# Patient Record
Sex: Female | Born: 2000
Health system: Southern US, Community
[De-identification: ages and names within clinical notes are randomized; demographics above are authoritative.]

## PROBLEM LIST (undated history)

## (undated) DIAGNOSIS — K219 Gastro-esophageal reflux disease without esophagitis: Secondary | ICD-10-CM

## (undated) DIAGNOSIS — IMO0001 Reserved for inherently not codable concepts without codable children: Secondary | ICD-10-CM

## (undated) DIAGNOSIS — K259 Gastric ulcer, unspecified as acute or chronic, without hemorrhage or perforation: Secondary | ICD-10-CM

## (undated) DIAGNOSIS — J302 Other seasonal allergic rhinitis: Secondary | ICD-10-CM

## (undated) DIAGNOSIS — Z464 Encounter for fitting and adjustment of orthodontic device: Secondary | ICD-10-CM

## (undated) DIAGNOSIS — A749 Chlamydial infection, unspecified: Secondary | ICD-10-CM

## (undated) DIAGNOSIS — F419 Anxiety disorder, unspecified: Secondary | ICD-10-CM

## (undated) DIAGNOSIS — B974 Respiratory syncytial virus as the cause of diseases classified elsewhere: Secondary | ICD-10-CM

## (undated) DIAGNOSIS — F32A Depression, unspecified: Secondary | ICD-10-CM

## (undated) DIAGNOSIS — B338 Other specified viral diseases: Secondary | ICD-10-CM

## (undated) HISTORY — DX: Gastro-esophageal reflux disease without esophagitis: K21.9

## (undated) HISTORY — DX: Gastric ulcer, unspecified as acute or chronic, without hemorrhage or perforation: K25.9

## (undated) HISTORY — DX: Depression, unspecified: F32.A

## (undated) HISTORY — DX: Anxiety disorder, unspecified: F41.9

## (undated) HISTORY — PX: DENTAL REHABILITATION: SHX1449

---
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
## 2019-01-16 ENCOUNTER — Ambulatory Visit: Payer: BLUE CROSS/BLUE SHIELD | Admitting: Obstetrics and Gynecology

## 2019-01-17 ENCOUNTER — Encounter: Payer: Self-pay | Admitting: Obstetrics and Gynecology

## 2019-01-17 ENCOUNTER — Ambulatory Visit (INDEPENDENT_AMBULATORY_CARE_PROVIDER_SITE_OTHER): Payer: BC Managed Care – PPO | Admitting: Obstetrics and Gynecology

## 2019-01-17 ENCOUNTER — Other Ambulatory Visit: Payer: Self-pay

## 2019-01-17 VITALS — BP 102/70 | Ht 65.0 in | Wt 233.0 lb

## 2019-01-17 DIAGNOSIS — F419 Anxiety disorder, unspecified: Secondary | ICD-10-CM | POA: Diagnosis not present

## 2019-01-17 DIAGNOSIS — F329 Major depressive disorder, single episode, unspecified: Secondary | ICD-10-CM

## 2019-01-17 DIAGNOSIS — F32A Depression, unspecified: Secondary | ICD-10-CM

## 2019-01-17 MED ORDER — CITALOPRAM HYDROBROMIDE 20 MG PO TABS: ORAL_TABLET | ORAL | 0 refills | Status: DC

## 2019-01-17 NOTE — Patient Instructions (Signed)
I value your feedback and entrusting us with your care. If you get a [facility] patient survey, I would appreciate you taking the time to let us know about your experience today. Thank you! 

## 2019-01-17 NOTE — Progress Notes (Signed)
Ronnell Freshwater, NP   Chief Complaint  Patient presents with  . Follow-up    anxiety is terrible, gain weight    HPI:      [name] is a 18 y.o. G0P0000 who LMP was Patient's last menstrual period was 12/17/2018 (exact date)., presents today for anxiety/depression f/u. She has a long hx of anxiety without tx, but started on zoloft 25 mg about a yr ago. Sx improved at 2/20 appt but have since worsened and pt has had significant wt gain since 2/20 appt (33# due to increased appetite). Pt with daytime worry, but able to sleep at night. No SI. Pt would like to try celexa since her mom is on it with sx control. Pt has been trying to lose wt with exercise/MyFitness Pal app without success. Wanted to try phentermine.  She was also changed from [place] to yaz at 2/20 appt due to anxiety/emotional lability. Pt doesn't think yaz is cause of her increased sx and wants to continue this pill. Menses monthly, lasting 6 days, no BTB, mild dysmen occas.  Due for annual.    Past Medical History:  Diagnosis Date  . Anxiety   . GERD (gastroesophageal reflux disease)   . Orthodontics    Invisalign  . RSV (respiratory syncytial virus infection)    as infant  . Stomach ulcer     Past Surgical History:  Procedure Laterality Date  . DENTAL REHABILITATION     age 62  . TONSILLECTOMY AND ADENOIDECTOMY Bilateral 02/07/2017   Procedure: TONSILLECTOMY;  Surgeon: Clyde Canterbury, MD;  Location: [place];  Service: ENT;  Laterality: Bilateral;    Family History  Problem Relation Age of Onset  . Kidney disease Mother   . Breast cancer Neg Hx   . Ovarian cancer Neg Hx     Social History   Socioeconomic History  . Marital status: Single    Spouse name: Not on file  . Number of children: Not on file  . Years of education: Not on file  . Highest education level: Not on file  Occupational History  . Not on file  Social Needs  . Financial resource strain: Not on file   . Food insecurity    Worry: Not on file    Inability: Not on file  . Transportation needs    Medical: Not on file    Non-medical: Not on file  Tobacco Use  . Smoking status: Never Smoker  . Smokeless tobacco: Never Used  Substance and Sexual Activity  . Alcohol use: Never    Frequency: Never  . Drug use: Never  . Sexual activity: Yes    Birth control/protection: Pill  Lifestyle  . Physical activity    Days per week: Not on file    Minutes per session: Not on file  . Stress: Not on file  Relationships  . Social Herbalist on phone: Not on file    Gets together: Not on file    Attends religious service: Not on file    Active member of club or organization: Not on file    Attends meetings of clubs or organizations: Not on file    Relationship status: Not on file  . Intimate partner violence    Fear of current or ex partner: Not on file    Emotionally abused: Not on file    Physically abused: Not on file    Forced sexual activity: Not on file  Other Topics Concern  . Not on file  Social History Narrative  . Not on file    Outpatient Medications Prior to Visit  Medication Sig Dispense Refill  . drospirenone-ethinyl estradiol (YAZ) 3-0.02 MG tablet Take 1 tablet by mouth daily. 3 Package 1  . sertraline (ZOLOFT) 25 MG tablet Take 1 tablet (25 mg total) by mouth daily. 90 tablet 1   No facility-administered medications prior to visit.       ROS:  Review of Systems  Constitutional: Negative for fatigue, fever and unexpected weight change.  Respiratory: Negative for cough, shortness of breath and wheezing.   Cardiovascular: Negative for chest pain, palpitations and leg swelling.  Gastrointestinal: Negative for blood in stool, constipation, diarrhea, nausea and vomiting.  Endocrine: Negative for cold intolerance, heat intolerance and polyuria.  Genitourinary: Negative for dyspareunia, dysuria, flank pain, frequency, genital sores, hematuria, menstrual  problem, pelvic pain, urgency, vaginal bleeding, vaginal discharge and vaginal pain.  Musculoskeletal: Negative for back pain, joint swelling and myalgias.  Skin: Negative for rash.  Neurological: Negative for dizziness, syncope, light-headedness, numbness and headaches.  Hematological: Negative for adenopathy.  Psychiatric/Behavioral: Positive for agitation. Negative for confusion, sleep disturbance and suicidal ideas. The patient is not nervous/anxious.     OBJECTIVE:   Vitals:  BP 102/70   Ht 5\' 5"  (1.651 m)   Wt 233 lb (105.7 kg)   LMP 12/17/2018 (Exact Date)   BMI 38.77 kg/m   Physical Exam Vitals signs reviewed.  Constitutional:      Appearance: She is well-developed.  Neck:     Musculoskeletal: Normal range of motion.  Pulmonary:     Effort: Pulmonary effort is normal.  Musculoskeletal: Normal range of motion.  Skin:    General: Skin is warm and dry.  Neurological:     General: No focal deficit present.     Mental Status: She is alert and oriented to person, place, and time.     Cranial Nerves: No cranial nerve deficit.  Psychiatric:        Mood and Affect: Mood normal.        Behavior: Behavior normal.        Thought Content: Thought content normal.        Judgment: Judgment normal.     Results:  GAD 7 : Generalized Anxiety Score 01/17/2019  Nervous, Anxious, on Edge 3  Control/stop worrying 3  Worry too much - different things 3  Trouble relaxing 3  Restless 1  Easily annoyed or irritable 2  Afraid - awful might happen 3  Total GAD 7 Score 18  Anxiety Difficulty Very difficult     Depression screen PHQ 2/9 01/17/2019  Decreased Interest 2  Down, Depressed, Hopeless 3  PHQ - 2 Score 5  Altered sleeping 2  Tired, decreased energy 3  Change in appetite 3  Feeling bad or failure about yourself  3  Trouble concentrating 1  Moving slowly or fidgety/restless 1  Suicidal thoughts 0  PHQ-9 Score 18  Difficult doing work/chores Very difficult     Assessment/Plan: Anxiety and depression - Plan: citalopram (CELEXA) 20 MG tablet Change from zoloft to celexa. Can stop zoloft since only on 25 mg dose, start celexa today. RTO in 4 wks for sx f/u at annual.   Weight gain--pt interested in phentermine. Will eval appetite/anxiety sx at 4 wk f/u first and then discuss Rx meds prn.     Meds ordered this encounter  Medications  . citalopram (CELEXA) 20 MG tablet  Sig: Take 1/2 tab daily for 6 days, then 1 tab daily    Dispense:  30 tablet    Refill:  0    Order Specific Question:   Supervising Provider    Answer:   HARRIS, Nadara MustardROBERT P [[id number]]      Return in about 4 weeks (around 02/14/2019) for annual with anxiety/depression f/u.  Kenyona Rena B. Drina Jobst, PA-C 01/17/2019 11:48 AM

## 2019-01-21 ENCOUNTER — Other Ambulatory Visit: Payer: Self-pay | Admitting: Obstetrics and Gynecology

## 2019-01-21 DIAGNOSIS — F419 Anxiety disorder, unspecified: Secondary | ICD-10-CM

## 2019-02-17 NOTE — Progress Notes (Signed)
Ronnell Freshwater, NP   Chief Complaint  Patient presents with  . Gynecologic Exam    HPI:      [name] is a 18 y.o. G0P0000 who LMP was Patient's last menstrual period was 02/17/2019 (exact date)., presents today for anxiety/depression f/u. Long hx of anxiety, started on sertraline 2019. Worked initially but then anxiety sx worsened. Changed to citalopram 8/20 since that is what her mom is on. Pt's sx much improved, no side effects. Wants to continue meds. Has noted wt gain since then, however. Is trying to count calories with MyFitness Pal app (getting 1300 cal daily) and exercising more but has gained 6# by our scales in past month. Pt concerned that happening so fast.   Was supposed to do annual today but wants to reschedule that due to heavier day of menses. Needs OCP RF till appt. Doing well with yaz.    Past Medical History:  Diagnosis Date  . Anxiety   . GERD (gastroesophageal reflux disease)   . Orthodontics    Invisalign  . RSV (respiratory syncytial virus infection)    as infant  . Stomach ulcer     Past Surgical History:  Procedure Laterality Date  . DENTAL REHABILITATION     age 60  . TONSILLECTOMY AND ADENOIDECTOMY Bilateral 02/07/2017   Procedure: TONSILLECTOMY;  Surgeon: Clyde Canterbury, MD;  Location: [place];  Service: ENT;  Laterality: Bilateral;    Family History  Problem Relation Age of Onset  . Kidney disease Mother   . Breast cancer Neg Hx   . Ovarian cancer Neg Hx     Social History   Socioeconomic History  . Marital status: Single    Spouse name: Not on file  . Number of children: Not on file  . Years of education: Not on file  . Highest education level: Not on file  Occupational History  . Not on file  Social Needs  . Financial resource strain: Not on file  . Food insecurity    Worry: Not on file    Inability: Not on file  . Transportation needs    Medical: Not on file    Non-medical: Not on file  Tobacco  Use  . Smoking status: Never Smoker  . Smokeless tobacco: Never Used  Substance and Sexual Activity  . Alcohol use: Never    Frequency: Never  . Drug use: Never  . Sexual activity: Yes    Birth control/protection: Pill  Lifestyle  . Physical activity    Days per week: Not on file    Minutes per session: Not on file  . Stress: Not on file  Relationships  . Social Herbalist on phone: Not on file    Gets together: Not on file    Attends religious service: Not on file    Active member of club or organization: Not on file    Attends meetings of clubs or organizations: Not on file    Relationship status: Not on file  . Intimate partner violence    Fear of current or ex partner: Not on file    Emotionally abused: Not on file    Physically abused: Not on file    Forced sexual activity: Not on file  Other Topics Concern  . Not on file  Social History Narrative  . Not on file    Outpatient Medications Prior to Visit  Medication Sig Dispense Refill  . citalopram (CELEXA) 20 MG  tablet Take 1/2 tab daily for 6 days, then 1 tab daily 30 tablet 0  . drospirenone-ethinyl estradiol (YAZ) 3-0.02 MG tablet Take 1 tablet by mouth daily. 3 Package 1   No facility-administered medications prior to visit.       ROS:  Review of Systems  Constitutional: Positive for unexpected weight change. Negative for fatigue and fever.  Respiratory: Negative for cough, shortness of breath and wheezing.   Cardiovascular: Negative for chest pain, palpitations and leg swelling.  Gastrointestinal: Negative for blood in stool, constipation, diarrhea, nausea and vomiting.  Endocrine: Negative for cold intolerance, heat intolerance and polyuria.  Genitourinary: Negative for dyspareunia, dysuria, flank pain, frequency, genital sores, hematuria, menstrual problem, pelvic pain, urgency, vaginal bleeding, vaginal discharge and vaginal pain.  Musculoskeletal: Negative for back pain, joint swelling and  myalgias.  Skin: Negative for rash.  Neurological: Negative for dizziness, syncope, light-headedness, numbness and headaches.  Hematological: Negative for adenopathy.  Psychiatric/Behavioral: Negative for agitation, confusion, sleep disturbance and suicidal ideas. The patient is not nervous/anxious.    BREAST: No symptoms   OBJECTIVE:   Vitals:  BP 130/90   Ht 5\' 5"  (1.651 m)   Wt 239 lb (108.4 kg)   LMP 02/17/2019 (Exact Date)   BMI 39.77 kg/m   Physical Exam Vitals signs reviewed.  Constitutional:      Appearance: She is well-developed.  Neck:     Musculoskeletal: Normal range of motion.  Pulmonary:     Effort: Pulmonary effort is normal.  Musculoskeletal: Normal range of motion.  Skin:    General: Skin is warm and dry.  Neurological:     General: No focal deficit present.     Mental Status: She is alert and oriented to person, place, and time.     Cranial Nerves: No cranial nerve deficit.  Psychiatric:        Mood and Affect: Mood normal.        Behavior: Behavior normal.        Thought Content: Thought content normal.        Judgment: Judgment normal.     Results:  GAD 7 : Generalized Anxiety Score 02/18/2019 01/17/2019  Nervous, Anxious, on Edge 1 3  Control/stop worrying 1 3  Worry too much - different things 1 3  Trouble relaxing 0 3  Restless 0 1  Easily annoyed or irritable 1 2  Afraid - awful might happen 1 3  Total GAD 7 Score 5 18  Anxiety Difficulty Somewhat difficult Very difficult    Depression screen PHQ 2/9 02/18/2019  Decreased Interest 0  Down, Depressed, Hopeless 1  PHQ - 2 Score 1  Altered sleeping 0  Tired, decreased energy 0  Change in appetite 2  Feeling bad or failure about yourself  0  Trouble concentrating 0  Moving slowly or fidgety/restless 0  Suicidal thoughts 0  PHQ-9 Score 3  Difficult doing work/chores Not difficult at all    Assessment/Plan: Anxiety and depression - Plan: citalopram (CELEXA) 20 MG tablet; Sx much  improved. Cont celexa. RF until can come for annual.  Weight gain--re-eval at annual. Increase calories to 1500 daily (may be getting too few) and get high quality calories in fruit/veggies/lean meats. Cont exercise.   Encounter for surveillance of contraceptive pills--OCP RF. F/u for annual in 1 mo.   Meds ordered this encounter  Medications  . drospirenone-ethinyl estradiol (YAZ) 3-0.02 MG tablet    Sig: Take 1 tablet by mouth daily.    Dispense:  3  Package    Refill:  0    Order Specific Question:   Supervising Provider    Answer:   Nadara Mustard [id number]  . citalopram (CELEXA) 20 MG tablet    Sig: Take 1 tablet (20 mg total) by mouth daily.    Dispense:  30 tablet    Refill:  0    Order Specific Question:   Supervising Provider    Answer:   Nadara Mustard [[id number]]      Return in about 1 week (around 02/25/2019) for annual (needs to reschedule annual appt from today due to bleeding).  Barnabas Henriques B. Jaquane Boughner, PA-C 02/18/2019 4:41 PM

## 2019-02-18 ENCOUNTER — Ambulatory Visit (INDEPENDENT_AMBULATORY_CARE_PROVIDER_SITE_OTHER): Payer: BC Managed Care – PPO | Admitting: Obstetrics and Gynecology

## 2019-02-18 ENCOUNTER — Encounter: Payer: Self-pay | Admitting: Obstetrics and Gynecology

## 2019-02-18 ENCOUNTER — Other Ambulatory Visit: Payer: Self-pay

## 2019-02-18 VITALS — BP 130/90 | Ht 65.0 in | Wt 239.0 lb

## 2019-02-18 DIAGNOSIS — F329 Major depressive disorder, single episode, unspecified: Secondary | ICD-10-CM | POA: Diagnosis not present

## 2019-02-18 DIAGNOSIS — F419 Anxiety disorder, unspecified: Secondary | ICD-10-CM | POA: Diagnosis not present

## 2019-02-18 DIAGNOSIS — Z30011 Encounter for initial prescription of contraceptive pills: Secondary | ICD-10-CM | POA: Diagnosis not present

## 2019-02-18 DIAGNOSIS — F32A Depression, unspecified: Secondary | ICD-10-CM

## 2019-02-18 DIAGNOSIS — Z3041 Encounter for surveillance of contraceptive pills: Secondary | ICD-10-CM

## 2019-02-18 MED ORDER — CITALOPRAM HYDROBROMIDE 20 MG PO TABS: 20.0000 mg | ORAL_TABLET | Freq: Every day | ORAL | 0 refills | Status: DC

## 2019-02-18 MED ORDER — DROSPIRENONE-ETHINYL ESTRADIOL 3-0.02 MG PO TABS: 1.0000 | ORAL_TABLET | Freq: Every day | ORAL | 0 refills | Status: DC

## 2019-02-18 NOTE — Patient Instructions (Signed)
I value your feedback and entrusting us with your care. If you get a [facility] patient survey, I would appreciate you taking the time to let us know about your experience today. Thank you! 

## 2019-02-28 DIAGNOSIS — A749 Chlamydial infection, unspecified: Secondary | ICD-10-CM

## 2019-02-28 HISTORY — DX: Chlamydial infection, unspecified: A74.9

## 2019-03-04 NOTE — Progress Notes (Deleted)
PCP:  [name], NP   No chief complaint on file.    HPI:      [name] is a 18 y.o. G0P0000 who LMP was Patient's last menstrual period was 02/17/2019 (exact date)., presents today for her NP annual examination.  Her menses are regular every 28-30 days, lasting 6 days.  Dysmenorrhea mild, occurring first 1-2 days of flow. She does not have intermenstrual bleeding.  Sex activity: single partner, contraception - OCPs. Doing well with yaz, no increased anxiety/depression sx with it.  No hx of HTN, DVTs, seizures.  Last Pap: N/A Hx of STDs: none  There is no FH of breast cancer. There is no FH of ovarian cancer. The patient does not do self-breast exams.  Tobacco use: The patient denies current or previous tobacco use. Alcohol use: none No drug use.  Exercise: moderately active  She does get adequate calcium and Vitamin D in her diet. Unsure if she ever had [place] vaccine.  On citalopram 20 mg daily for anxiety/depression sx with sx releif. Had f/u 2 wks ago. Noted wt gain with celexa use. ***  Past Medical History:  Diagnosis Date  . Anxiety   . GERD (gastroesophageal reflux disease)   . Orthodontics    Invisalign  . RSV (respiratory syncytial virus infection)    as infant  . Stomach ulcer     Past Surgical History:  Procedure Laterality Date  . DENTAL REHABILITATION     age 42  . TONSILLECTOMY AND ADENOIDECTOMY Bilateral 02/07/2017   Procedure: TONSILLECTOMY;  Surgeon: Geanie Logan, MD;  Location: [facility];  Service: ENT;  Laterality: Bilateral;    Family History  Problem Relation Age of Onset  . Kidney disease Mother   . Breast cancer Neg Hx   . Ovarian cancer Neg Hx     Social History   Socioeconomic History  . Marital status: Single    Spouse name: Not on file  . Number of children: Not on file  . Years of education: Not on file  . Highest education level: Not on file  Occupational History  . Not on file  Social Needs   . Financial resource strain: Not on file  . Food insecurity    Worry: Not on file    Inability: Not on file  . Transportation needs    Medical: Not on file    Non-medical: Not on file  Tobacco Use  . Smoking status: Never Smoker  . Smokeless tobacco: Never Used  Substance and Sexual Activity  . Alcohol use: Never    Frequency: Never  . Drug use: Never  . Sexual activity: Yes    Birth control/protection: Pill  Lifestyle  . Physical activity    Days per week: Not on file    Minutes per session: Not on file  . Stress: Not on file  Relationships  . Social Musician on phone: Not on file    Gets together: Not on file    Attends religious service: Not on file    Active member of club or organization: Not on file    Attends meetings of clubs or organizations: Not on file    Relationship status: Not on file  . Intimate partner violence    Fear of current or ex partner: Not on file    Emotionally abused: Not on file    Physically abused: Not on file    Forced sexual activity: Not on file  Other Topics Concern  . Not on file  Social History Narrative  . Not on file    Outpatient Medications Prior to Visit  Medication Sig Dispense Refill  . citalopram (CELEXA) 20 MG tablet Take 1 tablet (20 mg total) by mouth daily. 30 tablet 0  . drospirenone-ethinyl estradiol (YAZ) 3-0.02 MG tablet Take 1 tablet by mouth daily. 3 Package 0   No facility-administered medications prior to visit.       ROS:  Review of Systems  Constitutional: Negative for fatigue, fever and unexpected weight change.  Respiratory: Negative for cough, shortness of breath and wheezing.   Cardiovascular: Negative for chest pain, palpitations and leg swelling.  Gastrointestinal: Negative for blood in stool, constipation, diarrhea, nausea and vomiting.  Endocrine: Negative for cold intolerance, heat intolerance and polyuria.  Genitourinary: Negative for dyspareunia, dysuria, flank pain, frequency,  genital sores, hematuria, menstrual problem, pelvic pain, urgency, vaginal bleeding, vaginal discharge and vaginal pain.  Musculoskeletal: Negative for back pain, joint swelling and myalgias.  Skin: Negative for rash.  Neurological: Positive for headaches. Negative for dizziness, syncope, light-headedness and numbness.  Hematological: Negative for adenopathy.  Psychiatric/Behavioral: Positive for agitation and dysphoric mood. Negative for confusion, sleep disturbance and suicidal ideas. The patient is not nervous/anxious.    BREAST: No symptoms   Objective: LMP 02/17/2019 (Exact Date)    Physical Exam Constitutional:      Appearance: She is well-developed.  Genitourinary:     Vagina and uterus normal.     No vaginal discharge, erythema or tenderness.     No cervical motion tenderness or polyp.     Uterus is not enlarged or tender.     No right or left adnexal mass present.     Right adnexa not tender.     Left adnexa not tender.  Neck:     Musculoskeletal: Normal range of motion.     Thyroid: No thyromegaly.  Cardiovascular:     Rate and Rhythm: Normal rate and regular rhythm.     Heart sounds: Normal heart sounds. No murmur.  Pulmonary:     Effort: Pulmonary effort is normal.     Breath sounds: Normal breath sounds.  Chest:     Breasts:        Right: No mass, nipple discharge, skin change or tenderness.        Left: No mass, nipple discharge, skin change or tenderness.  Abdominal:     Palpations: Abdomen is soft.     Tenderness: There is no abdominal tenderness. There is no guarding.  Musculoskeletal: Normal range of motion.  Neurological:     Mental Status: She is alert and oriented to person, place, and time.     Cranial Nerves: No cranial nerve deficit.  Psychiatric:        Behavior: Behavior normal.  Vitals signs reviewed.     Assessment/Plan: No diagnosis found.  No orders of the defined types were placed in this encounter.            GYN counsel STD  prevention, use and side effects of OCP's, adequate intake of calcium and vitamin D, diet and exercise     F/U  No follow-ups on file.   B. , PA-C 03/04/2019 10:21 AM

## 2019-03-05 ENCOUNTER — Ambulatory Visit: Payer: BC Managed Care – PPO | Admitting: Obstetrics and Gynecology

## 2019-03-24 NOTE — Progress Notes (Signed)
PCP:  Carlean JewsBoscia, Heather E, NP   Chief Complaint  Patient presents with  . Gynecologic Exam    would like UPT today     HPI:      [name] is a 18 y.o. G0P0000 who LMP was Patient's last menstrual period was 03/17/2019 (exact date)., presents today for her annual examination.  Her menses are regular every 28-30 days, lasting 6 days.  Dysmenorrhea mild, occurring first 1-2 days of flow. She does not usually have intermenstrual bleeding but did last cycle without late/missed pills. LMP normal flow. No need for UPT today.   Sex activity: single partner, contraception - OCPs. Doing well with yaz, no increased anxiety/depression sx with it.  No hx of HTN, DVTs, seizures.  Last Pap: N/A Hx of STDs: none  There is no FH of breast cancer. There is no FH of ovarian cancer. The patient does not do self-breast exams.  Tobacco use: The patient denies current or previous tobacco use. Alcohol use: none No drug use.  Exercise: moderately active  She does get adequate calcium but not Vitamin D in her diet.  On citalopram 20 mg daily for anxiety/depression with sx relief at f/u 4 wks ago, but noticing anxiety sx increase recently. Noted wt gain with celexa use. Is doing diet/exercise changes without wt loss. Pt exercising 3 times wkly doing cardio and counting calories. Has gained 2# on our scale from 9/20 appt.   Past Medical History:  Diagnosis Date  . Anxiety   . GERD (gastroesophageal reflux disease)   . Orthodontics    Invisalign  . RSV (respiratory syncytial virus infection)    as infant  . Stomach ulcer     Past Surgical History:  Procedure Laterality Date  . DENTAL REHABILITATION     age 18  . TONSILLECTOMY AND ADENOIDECTOMY Bilateral 02/07/2017   Procedure: TONSILLECTOMY;  Surgeon: Geanie LoganBennett, Paul, MD;  [facility];  Service: ENT;  Laterality: Bilateral;    Family History  Problem Relation Age of Onset  . Kidney disease Mother   . Breast cancer  Neg Hx   . Ovarian cancer Neg Hx     Social History   Socioeconomic History  . Marital status: Single    Spouse name: Not on file  . Number of children: Not on file  . Years of education: Not on file  . Highest education level: Not on file  Occupational History  . Not on file  Social Needs  . Financial resource strain: Not on file  . Food insecurity    Worry: Not on file    Inability: Not on file  . Transportation needs    Medical: Not on file    Non-medical: Not on file  Tobacco Use  . Smoking status: Never Smoker  . Smokeless tobacco: Never Used  Substance and Sexual Activity  . Alcohol use: Never    Frequency: Never  . Drug use: Never  . Sexual activity: Yes    Birth control/protection: Pill  Lifestyle  . Physical activity    Days per week: Not on file    Minutes per session: Not on file  . Stress: Not on file  Relationships  . Social Musicianconnections    Talks on phone: Not on file    Gets together: Not on file    Attends religious service: Not on file    Active member of club or organization: Not on file    Attends meetings of clubs or organizations:  Not on file    Relationship status: Not on file  . Intimate partner violence    Fear of current or ex partner: Not on file    Emotionally abused: Not on file    Physically abused: Not on file    Forced sexual activity: Not on file  Other Topics Concern  . Not on file  Social History Narrative  . Not on file    Outpatient Medications Prior to Visit  Medication Sig Dispense Refill  . citalopram (CELEXA) 20 MG tablet Take 1 tablet (20 mg total) by mouth daily. 30 tablet 0  . drospirenone-ethinyl estradiol (YAZ) 3-0.02 MG tablet Take 1 tablet by mouth daily. 3 Package 0   No facility-administered medications prior to visit.       ROS:  Review of Systems  Constitutional: Negative for fatigue, fever and unexpected weight change.  Respiratory: Negative for cough, shortness of breath and wheezing.    Cardiovascular: Negative for chest pain, palpitations and leg swelling.  Gastrointestinal: Negative for blood in stool, constipation, diarrhea, nausea and vomiting.  Endocrine: Negative for cold intolerance, heat intolerance and polyuria.  Genitourinary: Negative for dyspareunia, dysuria, flank pain, frequency, genital sores, hematuria, menstrual problem, pelvic pain, urgency, vaginal bleeding, vaginal discharge and vaginal pain.  Musculoskeletal: Negative for back pain, joint swelling and myalgias.  Skin: Negative for rash.  Neurological: Negative for dizziness, syncope, light-headedness, numbness and headaches.  Hematological: Negative for adenopathy.  Psychiatric/Behavioral: Positive for agitation. Negative for confusion, dysphoric mood, sleep disturbance and suicidal ideas. The patient is not nervous/anxious.   BREAST: No symptoms   Objective: BP 128/90   Ht 5\' 5"  (1.651 m)   Wt 241 lb (109.3 kg)   LMP 03/17/2019 (Exact Date)   BMI 40.10 kg/m    Physical Exam Constitutional:      Appearance: She is well-developed.  Genitourinary:     Vagina and uterus normal.     No vaginal discharge, erythema or tenderness.     No cervical motion tenderness or polyp.     Uterus is not enlarged or tender.     No right or left adnexal mass present.     Right adnexa not tender.     Left adnexa not tender.  Neck:     Musculoskeletal: Normal range of motion.     Thyroid: No thyromegaly.  Cardiovascular:     Rate and Rhythm: Normal rate and regular rhythm.     Heart sounds: Normal heart sounds. No murmur.  Pulmonary:     Effort: Pulmonary effort is normal.     Breath sounds: Normal breath sounds.  Chest:     Breasts:        Right: No mass, nipple discharge, skin change or tenderness.        Left: No mass, nipple discharge, skin change or tenderness.  Abdominal:     Palpations: Abdomen is soft.     Tenderness: There is no abdominal tenderness. There is no guarding.  Musculoskeletal:  Normal range of motion.  Neurological:     Mental Status: She is alert and oriented to person, place, and time.     Cranial Nerves: No cranial nerve deficit.  Psychiatric:        Behavior: Behavior normal.  Vitals signs reviewed.     Assessment/Plan: Encounter for annual routine gynecological examination  Screening for STD (sexually transmitted disease) - Plan: Cervicovaginal ancillary only  Encounter for surveillance of contraceptive pills - Plan: drospirenone-ethinyl estradiol (YAZ) 3-0.02 MG tablet; OCP  RF  Anxiety and depression - Plan: citalopram (CELEXA) 20 MG tablet; Anxiety sx slightly increased in past month. Increase to 1 1/2 tabs (30 mg) daily. Rx eRxd. F/u via phone in 1 mo with sx and Rx/sooner prn.  Weight loss--cont calorie counting, decreased "junk foods", increase exercise intensity.   Meds ordered this encounter  Medications  . citalopram (CELEXA) 20 MG tablet    Sig: Take 1.5 tablets (30 mg total) by mouth daily.    Dispense:  45 tablet    Refill:  0    Order Specific Question:   Supervising Provider    Answer:   Gae Dry [id number]  . drospirenone-ethinyl estradiol (YAZ) 3-0.02 MG tablet    Sig: Take 1 tablet by mouth daily.    Dispense:  3 Package    Refill:  3    Order Specific Question:   Supervising Provider    Answer:   Gae Dry [[id number]]             GYN counsel STD prevention, use and side effects of OCP's, adequate intake of calcium and vitamin D, diet and exercise     F/U  Return in about 1 year (around 03/24/2020).  Marline Morace B. Lilton Pare, PA-C 03/25/2019 11:49 AM

## 2019-03-25 ENCOUNTER — Encounter: Payer: Self-pay | Admitting: Obstetrics and Gynecology

## 2019-03-25 ENCOUNTER — Ambulatory Visit (INDEPENDENT_AMBULATORY_CARE_PROVIDER_SITE_OTHER): Payer: BC Managed Care – PPO | Admitting: Obstetrics and Gynecology

## 2019-03-25 ENCOUNTER — Other Ambulatory Visit: Payer: Self-pay

## 2019-03-25 VITALS — BP 128/90 | Ht 65.0 in | Wt 241.0 lb

## 2019-03-25 DIAGNOSIS — Z01419 Encounter for gynecological examination (general) (routine) without abnormal findings: Secondary | ICD-10-CM

## 2019-03-25 DIAGNOSIS — F419 Anxiety disorder, unspecified: Secondary | ICD-10-CM | POA: Insufficient documentation

## 2019-03-25 DIAGNOSIS — F329 Major depressive disorder, single episode, unspecified: Secondary | ICD-10-CM | POA: Insufficient documentation

## 2019-03-25 DIAGNOSIS — F32A Depression, unspecified: Secondary | ICD-10-CM

## 2019-03-25 DIAGNOSIS — Z113 Encounter for screening for infections with a predominantly sexual mode of transmission: Secondary | ICD-10-CM

## 2019-03-25 DIAGNOSIS — Z3041 Encounter for surveillance of contraceptive pills: Secondary | ICD-10-CM

## 2019-03-25 MED ORDER — DROSPIRENONE-ETHINYL ESTRADIOL 3-0.02 MG PO TABS
1.0000 | ORAL_TABLET | Freq: Every day | ORAL | 3 refills | Status: DC
Start: 2019-03-25 — End: 2020-09-08

## 2019-03-25 MED ORDER — CITALOPRAM HYDROBROMIDE 20 MG PO TABS: 30.0000 mg | ORAL_TABLET | Freq: Every day | ORAL | 0 refills | Status: DC

## 2019-03-25 NOTE — Patient Instructions (Signed)
I value your feedback and entrusting us with your care. If you get a [facility] patient survey, I would appreciate you taking the time to let us know about your experience today. Thank you! 

## 2019-03-27 ENCOUNTER — Telehealth: Payer: Self-pay | Admitting: Obstetrics and Gynecology

## 2019-03-27 ENCOUNTER — Encounter: Payer: Self-pay | Admitting: Obstetrics and Gynecology

## 2019-03-27 DIAGNOSIS — A749 Chlamydial infection, unspecified: Secondary | ICD-10-CM

## 2019-03-27 LAB — CERVICOVAGINAL ANCILLARY ONLY
Chlamydia: POSITIVE — AB
Comment: NEGATIVE
Comment: NORMAL
Neisseria Gonorrhea: NEGATIVE

## 2019-03-27 MED ORDER — AZITHROMYCIN 500 MG PO TABS: 1000.0000 mg | ORAL_TABLET | Freq: Once | ORAL | 0 refills | Status: AC

## 2019-03-27 NOTE — Telephone Encounter (Signed)
Pt aware of chlamydia. Rx azithro eRxd. Partner needs tx. RTO in 4 wks for TOC. RN to notify [facility].

## 2019-03-27 NOTE — Telephone Encounter (Signed)
[facility] notified. 

## 2019-03-28 ENCOUNTER — Telehealth: Payer: Self-pay | Admitting: Obstetrics and Gynecology

## 2019-03-28 NOTE — Telephone Encounter (Signed)
Yes, fine to take today

## 2019-03-28 NOTE — Telephone Encounter (Signed)
Pt says there is a label outside the medication bottle that says "Do not take with anti-acids". She took one last night cause she had a lot of heartburn, is it okay to take azithromycin today?

## 2019-03-28 NOTE — Telephone Encounter (Signed)
Patient has question about dosing of medication.

## 2019-03-28 NOTE — Telephone Encounter (Signed)
Pt aware.

## 2019-04-10 ENCOUNTER — Ambulatory Visit
Admission: EM | Admit: 2019-04-10 | Discharge: 2019-04-10 | Disposition: A | Discharge status: Home / Self Care | Payer: BC Managed Care – PPO | Attending: Family Medicine | Admitting: Family Medicine

## 2019-04-10 DIAGNOSIS — K529 Noninfective gastroenteritis and colitis, unspecified: Secondary | ICD-10-CM

## 2019-04-10 DIAGNOSIS — R112 Nausea with vomiting, unspecified: Secondary | ICD-10-CM

## 2019-04-10 DIAGNOSIS — R5383 Other fatigue: Secondary | ICD-10-CM | POA: Diagnosis not present

## 2019-04-10 DIAGNOSIS — R519 Headache, unspecified: Secondary | ICD-10-CM

## 2019-04-10 MED ORDER — ONDANSETRON 4 MG PO TBDP: 4.0000 mg | ORAL_TABLET | Freq: Three times a day (TID) | ORAL | 0 refills | Status: DC | PRN

## 2019-04-10 NOTE — Discharge Instructions (Signed)
Rest. Zofran as needed for nausea.  Fluids - gatorade, powerade, pedialyte.  Call with concerns.  Take care  Dr. Lacinda Axon

## 2019-04-10 NOTE — ED Triage Notes (Signed)
Pt presents with complaints of abdominal pain, diarrhea, emesis, fatigue and body aches x 24 hours. States that her boyfriends mother was dx with the flu and she was around her 3 days ago.

## 2019-04-10 NOTE — ED Provider Notes (Signed)
[facility]    CSN: [id number] Arrival date & time: 04/10/19  1125  History   Chief Complaint Chief Complaint  Patient presents with  . Fatigue  . Abdominal Pain  . Generalized Body Aches   HPI  18 year old female presents with the above complaints.  Patient reports that symptoms began yesterday.  She reports nausea, vomiting, body aches, headache and fatigue.  Vomiting has now subsided.  She still endorses nausea.  Continues to have fatigue.  She is tolerating fluids today.  She has been taking Tylenol regularly.  No documented fever.  She states that her boyfriend's mother has recently been diagnosed with the flu.  No other reported sick contacts.  Rates her pain as 4/10 in severity.  No known inciting factor.  No known exacerbating factors.   PMH, Surgical Hx, Family Hx, Social History reviewed and updated as below. Past Medical History:  Diagnosis Date  . Anxiety   . Chlamydia 02/2019  . GERD (gastroesophageal reflux disease)   . Orthodontics    Invisalign  . RSV (respiratory syncytial virus infection)    as infant  . Stomach ulcer    Patient Active Problem List   Diagnosis Date Noted  . Anxiety and depression 03/25/2019  . Anxiety 07/24/2018   Past Surgical History:  Procedure Laterality Date  . DENTAL REHABILITATION     age 15  . TONSILLECTOMY AND ADENOIDECTOMY Bilateral 02/07/2017   Procedure: TONSILLECTOMY;  Surgeon: Clyde Canterbury, MD;  Location: [place];  Service: ENT;  Laterality: Bilateral;    OB History    Gravida  0   Para  0   Term  0   Preterm  0   AB  0   Living  0     SAB  0   TAB  0   Ectopic  0   Multiple  0   Live Births  0            Home Medications    Prior to Admission medications   Medication Sig Start Date End Date Taking? Authorizing Provider  citalopram (CELEXA) 20 MG tablet Take 1.5 tablets (30 mg total) by mouth daily. 51/76/16  Yes Copland, Deirdre Evener, PA-C  drospirenone-ethinyl  estradiol (YAZ) 3-0.02 MG tablet Take 1 tablet by mouth daily. 07/37/10  Yes Copland, Elmo Putt B, PA-C  ondansetron (ZOFRAN-ODT) 4 MG disintegrating tablet Take 1 tablet (4 mg total) by mouth every 8 (eight) hours as needed for nausea or vomiting. 04/10/19   Coral Spikes, DO    Family History Family History  Problem Relation Age of Onset  . Kidney disease Mother   . Cancer Father   . Breast cancer Neg Hx   . Ovarian cancer Neg Hx     Social History Social History   Tobacco Use  . Smoking status: Never Smoker  . Smokeless tobacco: Never Used  Substance Use Topics  . Alcohol use: Never    Frequency: Never  . Drug use: Never     Allergies   Patient has no known allergies.   Review of Systems Review of Systems  Constitutional: Positive for fatigue. Negative for fever.  Gastrointestinal: Positive for nausea and vomiting.  Musculoskeletal:       Body aches.  Neurological: Positive for headaches.   Physical Exam Triage Vital Signs ED Triage Vitals  Enc Vitals Group     BP 04/10/19 1214 106/68     Pulse Rate 04/10/19 1214 61     Resp 04/10/19  1214 18     Temp 04/10/19 1214 98.1 F (36.7 C)     Temp src --      SpO2 04/10/19 1214 100 %     Weight --      Height --      Head Circumference --      Peak Flow --      Pain Score 04/10/19 1212 4     Pain Loc --      Pain Edu? --      Excl. in GC? --    Updated Vital Signs BP 106/68   Pulse 61   Temp 98.1 F (36.7 C)   Resp 18   LMP 03/17/2019 (Exact Date)   SpO2 100%   Visual Acuity Right Eye Distance:   Left Eye Distance:   Bilateral Distance:    Right Eye Near:   Left Eye Near:    Bilateral Near:     Physical Exam Vitals signs and nursing note reviewed.  Constitutional:      General: She is not in acute distress.    Appearance: Normal appearance. She is obese. She is not ill-appearing.  HENT:     Head: Normocephalic and atraumatic.     Mouth/Throat:     Pharynx: Oropharynx is clear.  Eyes:      General:        Right eye: No discharge.        Left eye: No discharge.     Conjunctiva/sclera: Conjunctivae normal.  Cardiovascular:     Rate and Rhythm: Normal rate and regular rhythm.     Heart sounds: No murmur.  Pulmonary:     Effort: Pulmonary effort is normal.     Breath sounds: Normal breath sounds. No wheezing, rhonchi or rales.  Abdominal:     General: There is no distension.     Palpations: Abdomen is soft.     Tenderness: There is no abdominal tenderness.  Neurological:     Mental Status: She is alert.  Psychiatric:        Mood and Affect: Mood normal.        Behavior: Behavior normal.    UC Treatments / Results  Labs (all labs ordered are listed, but only abnormal results are displayed) Labs Reviewed  NOVEL CORONAVIRUS, NAA (HOSP ORDER, SEND-OUT TO REF LAB; TAT 18-24 HRS)    EKG   Radiology No results found.  Procedures Procedures (including critical care time)  Medications Ordered in UC Medications - No data to display  Initial Impression / Assessment and Plan / UC Course  I have reviewed the triage vital signs and the nursing notes.  Pertinent labs & imaging results that were available during my care of the patient were reviewed by me and considered in my medical decision making (see chart for details).    18 year old female presents with suspected gastroenteritis.  Offered flu swab and patient declined.  Awaiting Covid 19 test results.  Treating symptomatically with Zofran.  Push fluids.  Supportive care.  Final Clinical Impressions(s) / UC Diagnoses   Final diagnoses:  Gastroenteritis     Discharge Instructions     Rest. Zofran as needed for nausea.  Fluids - gatorade, powerade, pedialyte.  Call with concerns.  Take care  Dr. Adriana Simas    ED Prescriptions    Medication Sig Dispense Auth. Provider   ondansetron (ZOFRAN-ODT) 4 MG disintegrating tablet Take 1 tablet (4 mg total) by mouth every 8 (eight) hours as needed for nausea or  vomiting. 20 tablet Tommie Samsook, Caeley Dohrmann G, DO     PDMP not reviewed this encounter.   Tommie SamsCook, Savion Washam G, DO 04/10/19 1254

## 2019-04-11 LAB — NOVEL CORONAVIRUS, NAA (HOSP ORDER, SEND-OUT TO REF LAB; TAT 18-24 HRS): SARS-CoV-2, NAA: NOT DETECTED

## 2019-05-13 NOTE — Progress Notes (Signed)
Shelly Harris, Heather E, NP   Chief Complaint  Patient presents with  . Follow-up    TOC    HPI:      [name] is a 18 y.o. G0P0000 who LMP was No LMP recorded. (Menstrual status: Oral contraceptives)., presents today for chlamydia TOC. Diagnosed at 10/20 annual, treated with azithro. Partner tested neg but did tx anyway. Pt had neg TOC at Urgent care a couple wks ago but was done with urine and pt doesn't trust results. No vag sx, no new partners.   Doing well on OCPs. Didn't have menses this month, started new pill pack. Had missed some pills. Under increased stress with family health issues. Wants UPT.  Needs Rx RF citalopram 30 mg. Doing well with dose, no side effects. Increased from 20 mg at 10/20 annual with better sx control.    Patient Active Problem List   Diagnosis Date Noted  . Anxiety and depression 03/25/2019    Past Surgical History:  Procedure Laterality Date  . DENTAL REHABILITATION     age 18  . TONSILLECTOMY AND ADENOIDECTOMY Bilateral 02/07/2017   Procedure: TONSILLECTOMY;  Surgeon: Geanie LoganBennett, Paul, MD;  [facility];  Service: ENT;  Laterality: Bilateral;    Family History  Problem Relation Age of Onset  . Kidney disease Mother   . Cancer Father   . Breast cancer Neg Hx   . Ovarian cancer Neg Hx     Social History   Socioeconomic History  . Marital status: Single    Spouse name: Not on file  . Number of children: Not on file  . Years of education: Not on file  . Highest education level: Not on file  Occupational History  . Not on file  Tobacco Use  . Smoking status: Never Smoker  . Smokeless tobacco: Never Used  Substance and Sexual Activity  . Alcohol use: Never  . Drug use: Never  . Sexual activity: Yes    Birth control/protection: Pill  Other Topics Concern  . Not on file  Social History Narrative  . Not on file   Social Determinants of Health   Financial Resource Strain:   . Difficulty of Paying Living  Expenses: Not on file  Food Insecurity:   . Worried About Programme researcher, broadcasting/film/videounning Out of Food in the Last Year: Not on file  . Ran Out of Food in the Last Year: Not on file  Transportation Needs:   . Lack of Transportation (Medical): Not on file  . Lack of Transportation (Non-Medical): Not on file  Physical Activity:   . Days of Exercise per Week: Not on file  . Minutes of Exercise per Session: Not on file  Stress:   . Feeling of Stress : Not on file  Social Connections:   . Frequency of Communication with Friends and Family: Not on file  . Frequency of Social Gatherings with Friends and Family: Not on file  . Attends Religious Services: Not on file  . Active Member of Clubs or Organizations: Not on file  . Attends BankerClub or Organization Meetings: Not on file  . Marital Status: Not on file  Intimate Partner Violence:   . Fear of Current or Ex-Partner: Not on file  . Emotionally Abused: Not on file  . Physically Abused: Not on file  . Sexually Abused: Not on file    Outpatient Medications Prior to Visit  Medication Sig Dispense Refill  . drospirenone-ethinyl estradiol (YAZ) 3-0.02 MG tablet Take 1 tablet  by mouth daily. 3 Package 3  . ondansetron (ZOFRAN-ODT) 4 MG disintegrating tablet Take 1 tablet (4 mg total) by mouth every 8 (eight) hours as needed for nausea or vomiting. 20 tablet 0  . citalopram (CELEXA) 20 MG tablet Take 1.5 tablets (30 mg total) by mouth daily. 45 tablet 0   No facility-administered medications prior to visit.      ROS:  Review of Systems  Constitutional: Negative for fever.  Gastrointestinal: Negative for blood in stool, constipation, diarrhea, nausea and vomiting.  Genitourinary: Positive for menstrual problem. Negative for dyspareunia, dysuria, flank pain, frequency, hematuria, urgency, vaginal bleeding, vaginal discharge and vaginal pain.  Musculoskeletal: Negative for back pain.  Skin: Negative for rash.  BREAST: No symptoms   OBJECTIVE:   Vitals:  BP  124/80   Ht 5\' 4"  (1.626 m)   Wt 248 lb (112.5 kg)   BMI 42.57 kg/m   Physical Exam Vitals reviewed.  Constitutional:      Appearance: She is well-developed.  Pulmonary:     Effort: Pulmonary effort is normal.  Genitourinary:    General: Normal vulva.     Pubic Area: No rash.      Labia:        Right: No rash, tenderness or lesion.        Left: No rash, tenderness or lesion.      Vagina: Normal. No vaginal discharge, erythema or tenderness.     Cervix: Normal.     Uterus: Normal. Not enlarged and not tender.      Adnexa: Right adnexa normal and left adnexa normal.       Right: No mass or tenderness.         Left: No mass or tenderness.    Musculoskeletal:        General: Normal range of motion.     Cervical back: Normal range of motion.  Skin:    General: Skin is warm and dry.  Neurological:     General: No focal deficit present.     Mental Status: She is alert and oriented to person, place, and time.  Psychiatric:        Mood and Affect: Mood normal.        Behavior: Behavior normal.        Thought Content: Thought content normal.        Judgment: Judgment normal.     Results: Results for orders placed or performed in visit on 05/14/19 (from the past 24 hour(s))  POCT urine pregnancy     Status: Normal   Collection Time: 05/14/19 10:21 AM  Result Value Ref Range   Preg Test, Ur Negative Negative   GAD 7 : Generalized Anxiety Score 05/14/2019 02/18/2019 01/17/2019  Nervous, Anxious, on Edge 1 1 3   Control/stop worrying 1 1 3   Worry too much - different things 1 1 3   Trouble relaxing 1 0 3  Restless 0 0 1  Easily annoyed or irritable 1 1 2   Afraid - awful might happen 1 1 3   Total GAD 7 Score 6 5 18   Anxiety Difficulty Somewhat difficult Somewhat difficult Very difficult    Depression screen PHQ 2/9 05/14/2019  Decreased Interest 0  Down, Depressed, Hopeless 1  PHQ - 2 Score 1  Altered sleeping 1  Tired, decreased energy 0  Change in appetite 1  Feeling  bad or failure about yourself  1  Trouble concentrating 0  Moving slowly or fidgety/restless 0  Suicidal thoughts 0  PHQ-9  Score 4  Difficult doing work/chores Somewhat difficult     Assessment/Plan: Chlamydia - Plan: [place] STD, CANCELED: [place] STD; Neg TOC at Urgent care. Rechk today. Will f/u if pos.  Screening for STD (sexually transmitted disease) - Plan: [place] STD, CANCELED: [place] STD  Late menses - Plan: POCT urine pregnancy; Neg UPT, on OCPs. Reassurance. F/u prn.  Anxiety and depression - Plan: citalopram (CELEXA) 20 MG tablet; Doing well with 30 mg dose. Rx RF till annual. F/u sooner prn sx.   Meds ordered this encounter  Medications  . citalopram (CELEXA) 20 MG tablet    Sig: Take 1.5 tablets (30 mg total) by mouth daily.    Dispense:  135 tablet    Refill:  3    Order Specific Question:   Supervising Provider    Answer:   Gae Dry [[id number]]      Return if symptoms worsen or fail to improve.  Lisel Siegrist B. Daquavion Catala, PA-C 05/14/2019 11:55 AM

## 2019-05-14 ENCOUNTER — Ambulatory Visit (INDEPENDENT_AMBULATORY_CARE_PROVIDER_SITE_OTHER): Payer: BC Managed Care – PPO | Admitting: Obstetrics and Gynecology

## 2019-05-14 ENCOUNTER — Other Ambulatory Visit: Payer: Self-pay

## 2019-05-14 ENCOUNTER — Other Ambulatory Visit (HOSPITAL_COMMUNITY)
Admission: RE | Admit: 2019-05-14 | Discharge: 2019-05-14 | Disposition: A | Discharge status: Home / Self Care | Payer: BC Managed Care – PPO | Source: Ambulatory Visit | Attending: Obstetrics and Gynecology | Admitting: Obstetrics and Gynecology

## 2019-05-14 ENCOUNTER — Encounter: Payer: Self-pay | Admitting: Obstetrics and Gynecology

## 2019-05-14 VITALS — BP 124/80 | Ht 64.0 in | Wt 248.0 lb

## 2019-05-14 DIAGNOSIS — F419 Anxiety disorder, unspecified: Secondary | ICD-10-CM | POA: Diagnosis not present

## 2019-05-14 DIAGNOSIS — F32A Depression, unspecified: Secondary | ICD-10-CM

## 2019-05-14 DIAGNOSIS — Z113 Encounter for screening for infections with a predominantly sexual mode of transmission: Secondary | ICD-10-CM | POA: Insufficient documentation

## 2019-05-14 DIAGNOSIS — A749 Chlamydial infection, unspecified: Secondary | ICD-10-CM | POA: Diagnosis not present

## 2019-05-14 DIAGNOSIS — Z3202 Encounter for pregnancy test, result negative: Secondary | ICD-10-CM | POA: Diagnosis not present

## 2019-05-14 DIAGNOSIS — N926 Irregular menstruation, unspecified: Secondary | ICD-10-CM | POA: Diagnosis not present

## 2019-05-14 DIAGNOSIS — F329 Major depressive disorder, single episode, unspecified: Secondary | ICD-10-CM

## 2019-05-14 LAB — POCT URINE PREGNANCY: Preg Test, Ur: NEGATIVE

## 2019-05-14 MED ORDER — CITALOPRAM HYDROBROMIDE 20 MG PO TABS: 30.0000 mg | ORAL_TABLET | Freq: Every day | ORAL | 3 refills | Status: DC

## 2019-05-14 NOTE — Patient Instructions (Signed)
I value your feedback and entrusting us with your care. If you get a [facility] patient survey, I would appreciate you taking the time to let us know about your experience today. Thank you!  As of May 09, 2019, your lab results will be released to your MyChart immediately, before I even have a chance to see them. Please give me time to review them and contact you if there are any abnormalities. Thank you for your patience.  

## 2019-05-15 LAB — CERVICOVAGINAL ANCILLARY ONLY
Chlamydia: NEGATIVE
Comment: NEGATIVE
Comment: NORMAL
Neisseria Gonorrhea: NEGATIVE

## 2019-07-04 ENCOUNTER — Ambulatory Visit (INDEPENDENT_AMBULATORY_CARE_PROVIDER_SITE_OTHER): Payer: BC Managed Care – PPO | Admitting: Obstetrics and Gynecology

## 2019-07-04 ENCOUNTER — Other Ambulatory Visit: Payer: Self-pay

## 2019-07-04 ENCOUNTER — Encounter: Payer: Self-pay | Admitting: Obstetrics and Gynecology

## 2019-07-04 VITALS — BP 118/90 | Ht 64.0 in | Wt 254.0 lb

## 2019-07-04 DIAGNOSIS — Z0000 Encounter for general adult medical examination without abnormal findings: Secondary | ICD-10-CM

## 2019-07-04 DIAGNOSIS — F419 Anxiety disorder, unspecified: Secondary | ICD-10-CM

## 2019-07-04 DIAGNOSIS — F32A Depression, unspecified: Secondary | ICD-10-CM

## 2019-07-04 DIAGNOSIS — R635 Abnormal weight gain: Secondary | ICD-10-CM

## 2019-07-04 DIAGNOSIS — F329 Major depressive disorder, single episode, unspecified: Secondary | ICD-10-CM

## 2019-07-04 DIAGNOSIS — Z1329 Encounter for screening for other suspected endocrine disorder: Secondary | ICD-10-CM

## 2019-07-04 DIAGNOSIS — Z131 Encounter for screening for diabetes mellitus: Secondary | ICD-10-CM | POA: Diagnosis not present

## 2019-07-04 NOTE — Patient Instructions (Signed)
I value your feedback and entrusting us with your care. If you get a  patient survey, I would appreciate you taking the time to let us know about your experience today. Thank you!  As of May 09, 2019, your lab results will be released to your MyChart immediately, before I even have a chance to see them. Please give me time to review them and contact you if there are any abnormalities. Thank you for your patience.  

## 2019-07-04 NOTE — Progress Notes (Signed)
[name], NP   Chief Complaint  Patient presents with  . Follow-up    would to get blood work to check thyroid, appetite suppresants    HPI:      Shelly Harris is a 19 y.o. G0P0000 who LMP was Patient's last menstrual period was 06/06/2019 (approximate)., presents today for wt gain. Is getting 1400 cal daily of lean meats, fruits/veggies and exercising 5 times weekly. Up 6# since 12/20 appt. Has been a concern for pt this yr. Up 54# since 2/20 appt. No recent labs done. FH thyroid disorders and DM. Pt wondered about appetite suppressant since feels hungry all the time. Pt doing well on citalopram 30 mg daily for anxiety/depression. Had f/u appt 12/20. Last annual 10/20.  Patient Active Problem List   Diagnosis Date Noted  . Abnormal weight gain 07/04/2019  . Anxiety and depression 03/25/2019    Past Surgical History:  Procedure Laterality Date  . DENTAL REHABILITATION     age 77  . TONSILLECTOMY AND ADENOIDECTOMY Bilateral 02/07/2017   Procedure: TONSILLECTOMY;  Surgeon: Geanie Logan, MD;  Location: [facility];  Service: ENT;  Laterality: Bilateral;    Family History  Problem Relation Age of Onset  . Kidney disease Mother   . Cancer Father   . Breast cancer Neg Hx   . Ovarian cancer Neg Hx     Social History   Socioeconomic History  . Marital status: Single    Spouse name: Not on file  . Number of children: Not on file  . Years of education: Not on file  . Highest education level: Not on file  Occupational History  . Not on file  Tobacco Use  . Smoking status: Never Smoker  . Smokeless tobacco: Never Used  Substance and Sexual Activity  . Alcohol use: Never  . Drug use: Never  . Sexual activity: Yes    Birth control/protection: Condom, None  Other Topics Concern  . Not on file  Social History Narrative  . Not on file   Social Determinants of Health   Financial Resource Strain:   . Difficulty of Paying Living Expenses: Not on  file  Food Insecurity:   . Worried About Programme researcher, broadcasting/film/video in the Last Year: Not on file  . Ran Out of Food in the Last Year: Not on file  Transportation Needs:   . Lack of Transportation (Medical): Not on file  . Lack of Transportation (Non-Medical): Not on file  Physical Activity:   . Days of Exercise per Week: Not on file  . Minutes of Exercise per Session: Not on file  Stress:   . Feeling of Stress : Not on file  Social Connections:   . Frequency of Communication with Friends and Family: Not on file  . Frequency of Social Gatherings with Friends and Family: Not on file  . Attends Religious Services: Not on file  . Active Member of Clubs or Organizations: Not on file  . Attends Banker Meetings: Not on file  . Marital Status: Not on file  Intimate Partner Violence:   . Fear of Current or Ex-Partner: Not on file  . Emotionally Abused: Not on file  . Physically Abused: Not on file  . Sexually Abused: Not on file    Outpatient Medications Prior to Visit  Medication Sig Dispense Refill  . citalopram (CELEXA) 20 MG tablet Take 1.5 tablets (30 mg total) by mouth daily. 135 tablet 3  . drospirenone-ethinyl  estradiol (YAZ) 3-0.02 MG tablet Take 1 tablet by mouth daily. (Patient not taking: Reported on 07/04/2019) 3 Package 3  . ondansetron (ZOFRAN-ODT) 4 MG disintegrating tablet Take 1 tablet (4 mg total) by mouth every 8 (eight) hours as needed for nausea or vomiting. 20 tablet 0   No facility-administered medications prior to visit.     ROS:  Review of Systems  Constitutional: Positive for unexpected weight change. Negative for fever.  Gastrointestinal: Negative for blood in stool, constipation, diarrhea, nausea and vomiting.  Genitourinary: Negative for dyspareunia, dysuria, flank pain, frequency, hematuria, urgency, vaginal bleeding, vaginal discharge and vaginal pain.  Musculoskeletal: Negative for back pain.  Skin: Negative for rash.  BREAST: No  symptoms   OBJECTIVE:   Vitals:  BP 118/90   Ht 5\' 4"  (1.626 m)   Wt 254 lb (115.2 kg)   LMP 06/06/2019 (Approximate)   BMI 43.60 kg/m   Physical Exam Vitals reviewed.  Constitutional:      Appearance: She is well-developed.  Pulmonary:     Effort: Pulmonary effort is normal.  Musculoskeletal:        General: Normal range of motion.     Cervical back: Normal range of motion.  Skin:    General: Skin is warm and dry.  Neurological:     General: No focal deficit present.     Mental Status: She is alert and oriented to person, place, and time.     Cranial Nerves: No cranial nerve deficit.  Psychiatric:        Mood and Affect: Mood normal.        Behavior: Behavior normal.        Thought Content: Thought content normal.        Judgment: Judgment normal.     Assessment/Plan: Abnormal weight gain - Plan: TSH + free T4, Hemoglobin A1c, Comprehensive metabolic panel--check labs. Will f/u with results. If neg, pt can see MD for wt loss meds. Not sure appetite suppressant is indicated in that she is restricting her calories already. May need more calories daily due to exercise, so suggested 1600 cal daily.   Blood tests for routine general physical examination - Plan: Comprehensive metabolic panel  Thyroid disorder screening - Plan: TSH + free T4  Screening for diabetes mellitus - Plan: Hemoglobin A1c  Anxiety and depression--doing well on citalopram.    Return if symptoms worsen or fail to improve.  Cheral Cappucci B. Ikran Patman, PA-C 07/04/2019 2:46 PM

## 2019-07-05 LAB — COMPREHENSIVE METABOLIC PANEL
ALT: 21 IU/L (ref 0–32)
AST: 20 IU/L (ref 0–40)
Albumin/Globulin Ratio: 1.8 (ref 1.2–2.2)
Albumin: 4.6 g/dL (ref 3.9–5.0)
Alkaline Phosphatase: 78 IU/L (ref 39–117)
BUN/Creatinine Ratio: 12 (ref 9–23)
BUN: 10 mg/dL (ref 6–20)
Bilirubin Total: 0.6 mg/dL (ref 0.0–1.2)
CO2: 22 mmol/L (ref 20–29)
Calcium: 9.6 mg/dL (ref 8.7–10.2)
Chloride: 101 mmol/L (ref 96–106)
Creatinine, Ser: 0.82 mg/dL (ref 0.57–1.00)
GFR calc Af Amer: 120 mL/min/{1.73_m2} (ref >59)
GFR calc non Af Amer: 104 mL/min/{1.73_m2} (ref >59)
Globulin, Total: 2.6 g/dL (ref 1.5–4.5)
Glucose: 84 mg/dL (ref 65–99)
Potassium: 4.3 mmol/L (ref 3.5–5.2)
Sodium: 138 mmol/L (ref 134–144)
Total Protein: 7.2 g/dL (ref 6.0–8.5)

## 2019-07-05 LAB — TSH+FREE T4
Free T4: 1.09 ng/dL (ref 0.93–1.60)
TSH: 2.38 u[IU]/mL (ref 0.450–4.500)

## 2019-07-05 LAB — HEMOGLOBIN A1C
Est. average glucose Bld gHb Est-mCnc: 97 mg/dL
Hgb A1c MFr Bld: 5 % (ref 4.8–5.6)

## 2019-07-05 NOTE — Progress Notes (Signed)
Pts mom aware of results

## 2019-07-05 NOTE — Progress Notes (Signed)
Pls let pt know labs normal for thyroid and DM screen. She can make wt loss appt with MD if she wants meds. Thx.

## 2019-07-05 NOTE — Progress Notes (Signed)
Called pt, no answer, [facility]. 

## 2019-07-22 ENCOUNTER — Ambulatory Visit (INDEPENDENT_AMBULATORY_CARE_PROVIDER_SITE_OTHER)
Admit: 2019-07-22 | Discharge: 2019-07-22 | Disposition: A | Discharge status: Home / Self Care | Payer: BC Managed Care – PPO | Attending: Emergency Medicine | Admitting: Emergency Medicine

## 2019-07-22 ENCOUNTER — Other Ambulatory Visit: Payer: Self-pay

## 2019-07-22 ENCOUNTER — Ambulatory Visit
Admission: EM | Admit: 2019-07-22 | Discharge: 2019-07-22 | Disposition: A | Discharge status: Home / Self Care | Payer: BC Managed Care – PPO | Attending: Family Medicine | Admitting: Family Medicine

## 2019-07-22 DIAGNOSIS — R2241 Localized swelling, mass and lump, right lower limb: Secondary | ICD-10-CM | POA: Diagnosis not present

## 2019-07-22 DIAGNOSIS — R599 Enlarged lymph nodes, unspecified: Secondary | ICD-10-CM | POA: Diagnosis not present

## 2019-07-22 DIAGNOSIS — R1909 Other intra-abdominal and pelvic swelling, mass and lump: Secondary | ICD-10-CM

## 2019-07-22 DIAGNOSIS — R1030 Lower abdominal pain, unspecified: Secondary | ICD-10-CM | POA: Diagnosis not present

## 2019-07-22 NOTE — Discharge Instructions (Addendum)
Recommend following up with your primary care physician.

## 2019-07-22 NOTE — ED Triage Notes (Signed)
Patient states that she has noticed a lump in her right groin area that started 3-4 days ago. States that she has been noticing the area more when laying flat but has had some shooting pains with this.

## 2019-07-22 NOTE — ED Provider Notes (Signed)
[facility]    CSN: [id number] Arrival date & time: 07/22/19  1520      History   Chief Complaint Chief Complaint  Patient presents with  . Groin Pain    HPI Shelly Harris is a 19 y.o. female.   HPI  19 year old female presents with a lump that she has noticed in her right groin that started about 3 or 4 days ago.  States that the area is very tender to the touch.  He states that the area has grown in size.  It will also radiate pain down the medial aspect of her thigh and also up into her abdominal area.  Had no drainage from this area.  She states that the area is more noticed when she is flat but has had shooting pains particularly when it is palpated.  Has had no fever or chills.  Has had no weight loss recently.       Past Medical History:  Diagnosis Date  . Anxiety   . Chlamydia 02/2019  . GERD (gastroesophageal reflux disease)   . Orthodontics    Invisalign  . RSV (respiratory syncytial virus infection)    as infant  . Stomach ulcer     Patient Active Problem List   Diagnosis Date Noted  . Abnormal weight gain 07/04/2019  . Anxiety and depression 03/25/2019    Past Surgical History:  Procedure Laterality Date  . DENTAL REHABILITATION     age 33  . TONSILLECTOMY AND ADENOIDECTOMY Bilateral 02/07/2017   Procedure: TONSILLECTOMY;  Surgeon: Geanie Logan, MD;  Location: [facility];  Service: ENT;  Laterality: Bilateral;    OB History    Gravida  0   Para  0   Term  0   Preterm  0   AB  0   Living  0     SAB  0   TAB  0   Ectopic  0   Multiple  0   Live Births  0            Home Medications    Prior to Admission medications   Medication Sig Start Date End Date Taking? Authorizing Provider  citalopram (CELEXA) 20 MG tablet Take 1.5 tablets (30 mg total) by mouth daily. 05/14/19  Yes Copland, Ilona Sorrel, PA-C  drospirenone-ethinyl estradiol (YAZ) 3-0.02 MG tablet Take 1 tablet by mouth daily. 03/25/19  Yes  Copland, Ilona Sorrel, PA-C    Family History Family History  Problem Relation Age of Onset  . Kidney disease Mother   . Cancer Father   . Breast cancer Neg Hx   . Ovarian cancer Neg Hx     Social History Social History   Tobacco Use  . Smoking status: Never Smoker  . Smokeless tobacco: Never Used  Substance Use Topics  . Alcohol use: Never  . Drug use: Never     Allergies   Patient has no known allergies.   Review of Systems Review of Systems  Constitutional: Positive for activity change. Negative for appetite change, chills, fatigue and fever.  Genitourinary: Positive for pelvic pain.  All other systems reviewed and are negative.    Physical Exam Triage Vital Signs ED Triage Vitals  Enc Vitals Group     BP 07/22/19 1551 129/75     Pulse Rate 07/22/19 1551 74     Resp 07/22/19 1551 16     Temp 07/22/19 1551 98.4 F (36.9 C)     Temp Source 07/22/19 1551  Oral     SpO2 07/22/19 1551 100 %     Weight 07/22/19 1548 195 lb (88.5 kg)     Height 07/22/19 1548 5\' 4"  (1.626 m)     Head Circumference --      Peak Flow --      Pain Score 07/22/19 1548 5     Pain Loc --      Pain Edu? --      Excl. in [place]? --    No data found.  Updated Vital Signs BP 129/75 (BP Location: Right Arm)   Pulse 74   Temp 98.4 F (36.9 C) (Oral)   Resp 16   Ht 5\' 4"  (1.626 m)   Wt 195 lb (88.5 kg)   LMP 06/25/2019   SpO2 100%   BMI 33.47 kg/m   Visual Acuity Right Eye Distance:   Left Eye Distance:   Bilateral Distance:    Right Eye Near:   Left Eye Near:    Bilateral Near:     Physical Exam Vitals and nursing note reviewed. Exam conducted with a chaperone present.  Constitutional:      General: She is not in acute distress.    Appearance: Normal appearance. She is obese. She is not ill-appearing or toxic-appearing.  HENT:     Head: Normocephalic and atraumatic.  Musculoskeletal:        General: Tenderness present. No swelling, deformity or signs of injury. Normal  range of motion.     Comments: Examination of the right inguinal region shows no obvious swelling ecchymosis erythema.  There is no significant warmth.  She does shave the area but does not appear to have a folliculitis.  She has movable tender firm lesion in the inguinal fold measuring approximately 2 cm in diameter.  There is no fluctuance or induration present.  Distal pulses are palpable and full.  Skin:    General: Skin is warm and dry.  Neurological:     General: No focal deficit present.     Mental Status: She is alert and oriented to person, place, and time.  Psychiatric:        Mood and Affect: Mood normal.        Behavior: Behavior normal.        Thought Content: Thought content normal.        Judgment: Judgment normal.      UC Treatments / Results  Labs (all labs ordered are listed, but only abnormal results are displayed) Labs Reviewed - No data to display  EKG   Radiology [place] RT LOWER EXTREM LTD SOFT TISSUE NON VASCULAR  Result Date: 07/22/2019 CLINICAL DATA:  Palpable painful area in the right groin EXAM: ULTRASOUND right LOWER EXTREMITY LIMITED TECHNIQUE: Ultrasound examination of the lower extremity soft tissues was performed in the area of clinical concern. COMPARISON:  None. FINDINGS: Targeted ultrasound of the right groin performed in the region of palpable mass and pain. Multiple prominent lymph nodes are noted in the region. Dominant enlarged lymph node with loss of fatty hilus measuring up to 2.3 cm. Increased echogenicity within the fat of the right inguinal region IMPRESSION: 1. In the region of palpable concern are enlarged lymph nodes measuring up to 2.3 cm with loss of fatty hilus. Differential considerations include reactive nodes, infectious or inflammatory process, or potential lymphoproliferative disease. Increased echogenicity/probable edema within the fat surrounding the lymph nodes suggests infectious or inflammatory process. Further management will need to  be based on clinical grounds. Electronically Signed  By: Jasmine Pang M.D.   On: 07/22/2019 18:01    Procedures Procedures (including critical care time)  Medications Ordered in UC Medications - No data to display  Initial Impression / Assessment and Plan / UC Course  I have reviewed the triage vital signs and the nursing notes.  Pertinent labs & imaging results that were available during my care of the patient were reviewed by me and considered in my medical decision making (see chart for details).   19 year old female resents with a painful tender lump that she is noticed in her right groin.  Started 3 to 4 days ago.  He states that the areas become more uncomfortable last day or so.  He has pains that have been shooting into her medial upper thigh and also her abdomen.  She has no visible signs of active infection at this point.  Does not relate any recent infections.  She does shave the area but does not appear to have any folliculitis.  Patient was sent for ultrasound today.  Is interpreted as enlarged lymph nodes measuring up to 2.3 cm with loss of fatty hilus.  Radiologist my is that the increased echogenicity and probable edema within the fat surrounding the lymph node suggested infectious or inflammatory process.  I reviewed this with the patient.  Is sent her with a copy of his report.  I recommended that she need to follow-up with her primary care as soon as possible for further evaluation as necessary.  No care is provided at this time.   Final Clinical Impressions(s) / UC Diagnoses   Final diagnoses:  Mass of right inguinal region  Enlarged lymph nodes     Discharge Instructions     Recommend following up with your primary care physician.     ED Prescriptions    None     PDMP not reviewed this encounter.   Lutricia Feil, PA-C 07/22/19 1848

## 2019-07-25 ENCOUNTER — Ambulatory Visit (INDEPENDENT_AMBULATORY_CARE_PROVIDER_SITE_OTHER): Payer: BC Managed Care – PPO | Admitting: Obstetrics and Gynecology

## 2019-07-25 ENCOUNTER — Ambulatory Visit: Payer: BC Managed Care – PPO | Admitting: Obstetrics and Gynecology

## 2019-07-25 ENCOUNTER — Other Ambulatory Visit: Payer: Self-pay

## 2019-07-25 ENCOUNTER — Encounter: Payer: Self-pay | Admitting: Obstetrics and Gynecology

## 2019-07-25 VITALS — BP 127/67 | HR 76 | Ht 65.0 in | Wt 253.0 lb

## 2019-07-25 DIAGNOSIS — Z3202 Encounter for pregnancy test, result negative: Secondary | ICD-10-CM | POA: Diagnosis not present

## 2019-07-25 DIAGNOSIS — R590 Localized enlarged lymph nodes: Secondary | ICD-10-CM

## 2019-07-25 DIAGNOSIS — N926 Irregular menstruation, unspecified: Secondary | ICD-10-CM

## 2019-07-25 DIAGNOSIS — N925 Other specified irregular menstruation: Secondary | ICD-10-CM

## 2019-07-25 LAB — POCT URINE PREGNANCY: Preg Test, Ur: NEGATIVE

## 2019-07-25 NOTE — Progress Notes (Signed)
Obstetrics & Gynecology Office Visit   Chief Complaint:  Chief Complaint  Patient presents with  . Growth in groin area    Urgent care follow up 2/22,   . irregular cycles    positive upt test at home   History of Present Illness: 19 y.o. G0P0000 female who presents with a 5-day history of a swollen lymph node on her right inguinal area.  The area is painful.  She states that the area is no visible, only palpable. She went to urgent care on 2/22 where an ultrasound showed an enlarged right lymph node.  The area seems to be worse at night.  There is pain there most of the time. She has tried Tylenol and this helps a little.  Aggravating factors: going from sitting to standing, walking around.  Alleviating factors: heat and ice does help a little.  Associated symptoms: none. She denies fevers, chills, overlying skin changes.  She denies vaginal symptoms and rectal/anal symptoms.  She is sexually active.  She uses Yaz for contraception.  She had a positive pregnancy test at home yesterday.  She had a negative test today.  Her last period was a "while ago."  She had chlamydia in 02/2019 and was treated. She had a negative test of cure in 04/2019.  Her boyfriend was also treated, though his test was negative.  She has been regular in the past. Over the course of the past 6-8 months her periods have been all over the place.  She thinks it got worse after taking the medication for chlamydia.  She denies right leg swelling.     Past Medical History:  Diagnosis Date  . Anxiety   . Chlamydia 02/2019  . GERD (gastroesophageal reflux disease)   . Orthodontics    Invisalign  . RSV (respiratory syncytial virus infection)    as infant  . Stomach ulcer     Past Surgical History:  Procedure Laterality Date  . DENTAL REHABILITATION     age 36  . TONSILLECTOMY AND ADENOIDECTOMY Bilateral 02/07/2017   Procedure: TONSILLECTOMY;  Surgeon: Geanie Logan, MD;  Location: [facility];  Service: ENT;   Laterality: Bilateral;    Gynecologic History: LMP several months ago.   Obstetric History: G0P0000  Family History  Problem Relation Age of Onset  . Kidney disease Mother   . Cancer Father   . Breast cancer Neg Hx   . Ovarian cancer Neg Hx     Social History   Socioeconomic History  . Marital status: Single    Spouse name: Not on file  . Number of children: Not on file  . Years of education: Not on file  . Highest education level: Not on file  Occupational History  . Not on file  Tobacco Use  . Smoking status: Never Smoker  . Smokeless tobacco: Never Used  Substance and Sexual Activity  . Alcohol use: Never  . Drug use: Never  . Sexual activity: Yes    Birth control/protection: Condom, None  Other Topics Concern  . Not on file  Social History Narrative  . Not on file   Social Determinants of Health   Financial Resource Strain:   . Difficulty of Paying Living Expenses: Not on file  Food Insecurity:   . Worried About Programme researcher, broadcasting/film/video in the Last Year: Not on file  . Ran Out of Food in the Last Year: Not on file  Transportation Needs:   . Lack of Transportation (Medical): Not on file  .  Lack of Transportation (Non-Medical): Not on file  Physical Activity:   . Days of Exercise per Week: Not on file  . Minutes of Exercise per Session: Not on file  Stress:   . Feeling of Stress : Not on file  Social Connections:   . Frequency of Communication with Friends and Family: Not on file  . Frequency of Social Gatherings with Friends and Family: Not on file  . Attends Religious Services: Not on file  . Active Member of Clubs or Organizations: Not on file  . Attends Archivist Meetings: Not on file  . Marital Status: Not on file  Intimate Partner Violence:   . Fear of Current or Ex-Partner: Not on file  . Emotionally Abused: Not on file  . Physically Abused: Not on file  . Sexually Abused: Not on file   Allergies: No Known Allergies  Prior to  Admission medications   Medication Sig Start Date End Date Taking? Authorizing Provider  citalopram (CELEXA) 20 MG tablet Take 1.5 tablets (30 mg total) by mouth daily. 40/34/74  Yes Copland, Deirdre Evener, PA-C  drospirenone-ethinyl estradiol (YAZ) 3-0.02 MG tablet Take 1 tablet by mouth daily. 25/95/63  Yes Copland, Deirdre Evener, PA-C    Review of Systems  Constitutional: Negative.   HENT: Negative.   Eyes: Negative.   Respiratory: Negative.   Cardiovascular: Negative.   Gastrointestinal: Negative.   Genitourinary: Negative.        See HPI  Musculoskeletal: Negative.   Skin: Negative.   Neurological: Negative.   Psychiatric/Behavioral: Negative.      Physical Exam BP 127/67   Pulse 76   Ht 5\' 5"  (1.651 m)   Wt 253 lb (114.8 kg)   LMP 06/25/2019   BMI 42.10 kg/m  Patient's last menstrual period was 06/25/2019. Physical Exam Constitutional:      General: She is not in acute distress.    Appearance: Normal appearance. She is well-developed.  Genitourinary:     Genitourinary Comments: Declined by patient  HENT:     Head: Normocephalic and atraumatic.  Eyes:     General: No scleral icterus.    Conjunctiva/sclera: Conjunctivae normal.  Cardiovascular:     Rate and Rhythm: Normal rate and regular rhythm.     Heart sounds: No murmur. No friction rub. No gallop.   Pulmonary:     Effort: Pulmonary effort is normal. No respiratory distress.     Breath sounds: Normal breath sounds. No wheezing or rales.  Abdominal:     General: Bowel sounds are normal. There is no distension.     Palpations: Abdomen is soft. There is no mass.     Tenderness: There is no abdominal tenderness. There is no guarding or rebound.    Musculoskeletal:        General: Normal range of motion.     Cervical back: Normal range of motion and neck supple.  Neurological:     General: No focal deficit present.     Mental Status: She is alert and oriented to person, place, and time.     Cranial Nerves: No  cranial nerve deficit.  Skin:    General: Skin is warm and dry.     Findings: No erythema.  Psychiatric:        Mood and Affect: Mood normal.        Behavior: Behavior normal.        Judgment: Judgment normal.    POCT Urine pregnancy test: Negative   Assessment: 19 y.o.  G0P0000 female here for  1. Inguinal lymphadenopathy   2. Irregular menstrual cycle      Plan: Problem List Items Addressed This Visit    None    Visit Diagnoses    Inguinal lymphadenopathy    -  Primary   Irregular menstrual cycle       Relevant Orders   POCT urine pregnancy (Completed)     Differential is wide and includes lymphoproliferative disease, infectious process, inflammatory process.  Patient declined pelvic exam today. Lymph node palpated on abdominal exam.  With her recent history of chlamydia, there is the possibility of an infectious process. Can't rule out lymphogranuloma venereum.  Without proper pelvic exam I would not treat empirically.  She prefers to have her usual provider perform the pelvic exam. I recommended she be seen as soon as possible to have further workup performed.  She is concerned and states that she will get an appointment schedule ASAP with Helmut Muster Copland, PA-C.  No other significant lymphadenopathy noted. Would consider CBC, pelvic exam, STD screen (HIV, syphilis, etc), consideration for pelvic ultrasound, possible aspiration of lymph node for diagnostic purposes depending on any suggestive findings.    15 minutes spent in face to face discussion with > 50% spent in counseling,management, and coordination of care of her right inguinal lymphadenopathy.   Thomasene Mohair, MD 07/25/2019 2:49 PM

## 2019-07-30 ENCOUNTER — Ambulatory Visit: Payer: BC Managed Care – PPO | Admitting: Obstetrics and Gynecology

## 2019-07-30 NOTE — Progress Notes (Deleted)
Shelly Freshwater, NP   No chief complaint on file.   HPI:      [name] is a 19 y.o. G0P0000 who LMP was No LMP recorded., presents today for ***    Past Medical History:  Diagnosis Date  . Anxiety   . Chlamydia 02/2019  . GERD (gastroesophageal reflux disease)   . Orthodontics    Invisalign  . RSV (respiratory syncytial virus infection)    as infant  . Stomach ulcer     Past Surgical History:  Procedure Laterality Date  . DENTAL REHABILITATION     age 54  . TONSILLECTOMY AND ADENOIDECTOMY Bilateral 02/07/2017   Procedure: TONSILLECTOMY;  Surgeon: Shelly Canterbury, MD;  Location: [place];  Service: ENT;  Laterality: Bilateral;    Family History  Problem Relation Age of Onset  . Kidney disease Mother   . Cancer Father   . Breast cancer Neg Hx   . Ovarian cancer Neg Hx     Social History   Socioeconomic History  . Marital status: Single    Spouse name: Not on file  . Number of children: Not on file  . Years of education: Not on file  . Highest education level: Not on file  Occupational History  . Not on file  Tobacco Use  . Smoking status: Never Smoker  . Smokeless tobacco: Never Used  Substance and Sexual Activity  . Alcohol use: Never  . Drug use: Never  . Sexual activity: Yes    Birth control/protection: Condom, None  Other Topics Concern  . Not on file  Social History Narrative  . Not on file   Social Determinants of Health   Financial Resource Strain:   . Difficulty of Paying Living Expenses: Not on file  Food Insecurity:   . Worried About Charity fundraiser in the Last Year: Not on file  . Ran Out of Food in the Last Year: Not on file  Transportation Needs:   . Lack of Transportation (Medical): Not on file  . Lack of Transportation (Non-Medical): Not on file  Physical Activity:   . Days of Exercise per Week: Not on file  . Minutes of Exercise per Session: Not on file  Stress:   . Feeling of Stress : Not on  file  Social Connections:   . Frequency of Communication with Friends and Family: Not on file  . Frequency of Social Gatherings with Friends and Family: Not on file  . Attends Religious Services: Not on file  . Active Member of Clubs or Organizations: Not on file  . Attends Archivist Meetings: Not on file  . Marital Status: Not on file  Intimate Partner Violence:   . Fear of Current or Ex-Partner: Not on file  . Emotionally Abused: Not on file  . Physically Abused: Not on file  . Sexually Abused: Not on file    Outpatient Medications Prior to Visit  Medication Sig Dispense Refill  . citalopram (CELEXA) 20 MG tablet Take 1.5 tablets (30 mg total) by mouth daily. 135 tablet 3  . drospirenone-ethinyl estradiol (YAZ) 3-0.02 MG tablet Take 1 tablet by mouth daily. 3 Package 3   No facility-administered medications prior to visit.      ROS:  Review of Systems BREAST: No symptoms   OBJECTIVE:   Vitals:  There were no vitals taken for this visit.  Physical Exam  Results: No results found for this or any previous visit (from the  past 24 hour(s)).   Assessment/Plan: No diagnosis found.    No orders of the defined types were placed in this encounter.     No follow-ups on file.  Shelly Thayer B. Jordann Grime, PA-C 07/30/2019 12:01 PM

## 2020-04-25 IMAGING — US US EXTREM LOW*R* LIMITED
1 series · 13 of 13 positions shown · non-contrast
Comparison: None.

CLINICAL DATA: Palpable painful area in the right groin

EXAM:
ULTRASOUND right LOWER EXTREMITY LIMITED
TECHNIQUE: Ultrasound examination of the lower extremity soft tissues was
performed in the area of clinical concern.

[Series 1: us extrem low*right* limited · 0.10mm/px · 13 of 13 slices shown]
[im 1/13]
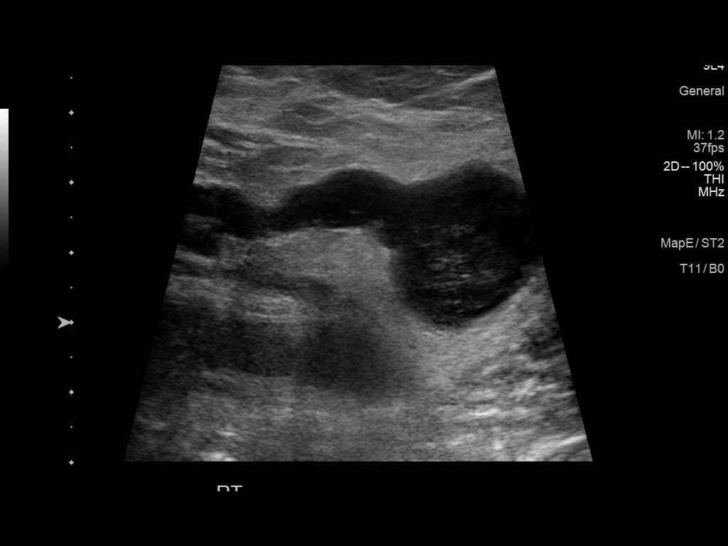
[im 2/13]
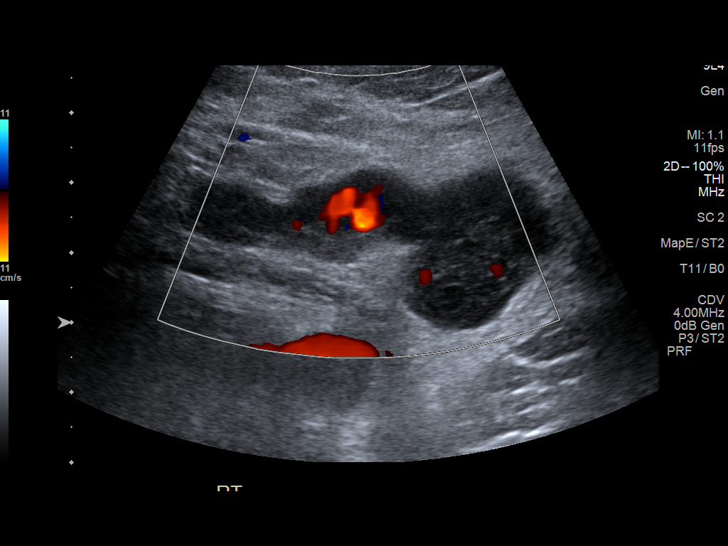
[im 3/13]
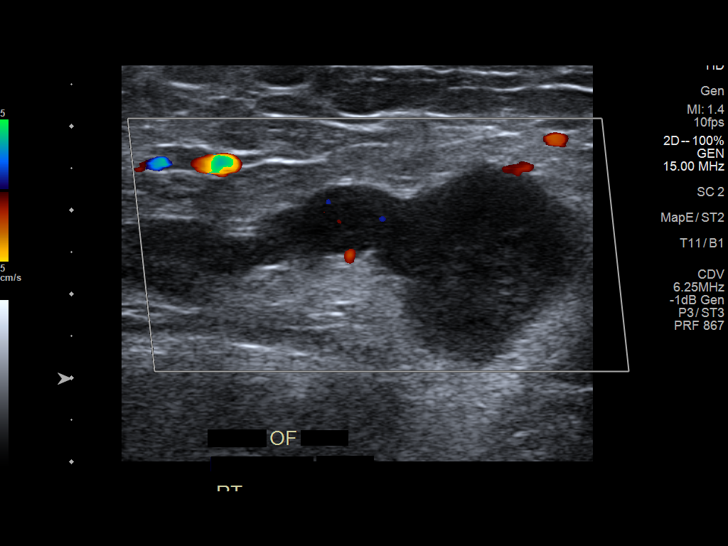
[im 4/13]
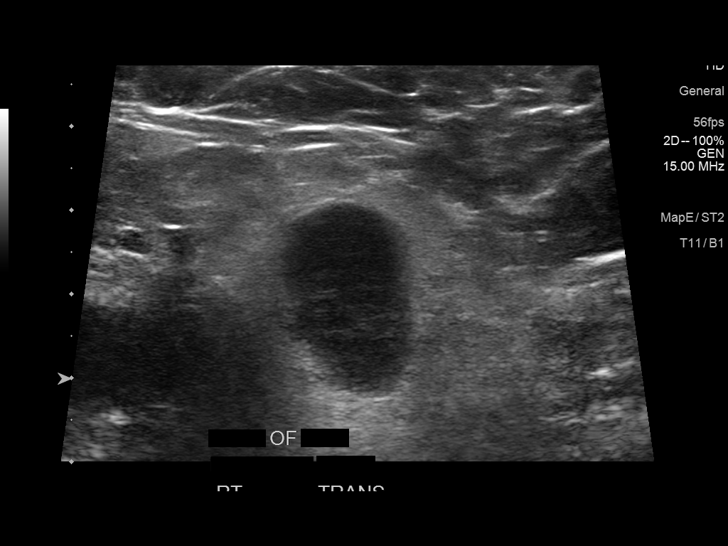
[im 5/13]
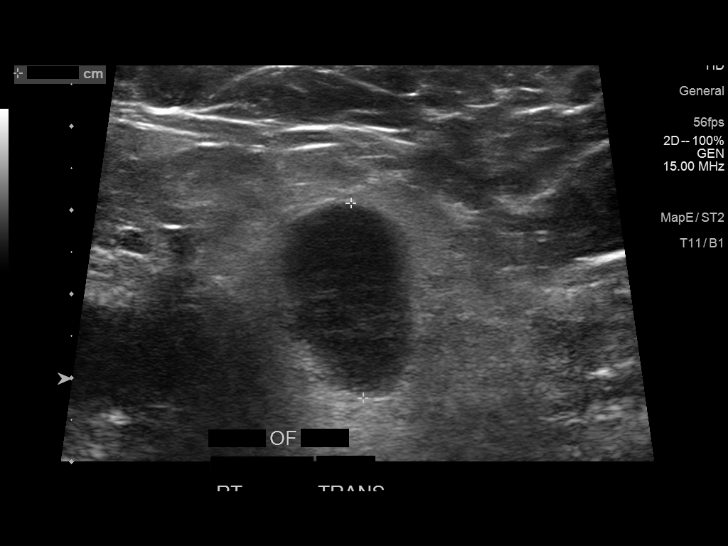
[im 6/13]
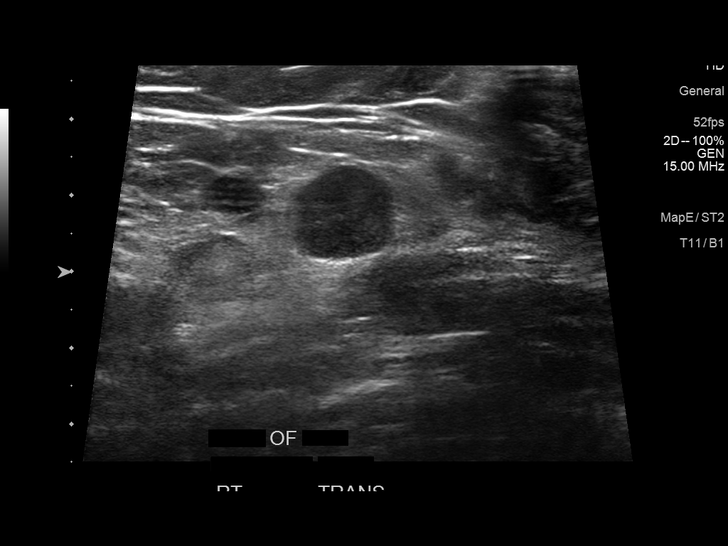
[im 7/13]
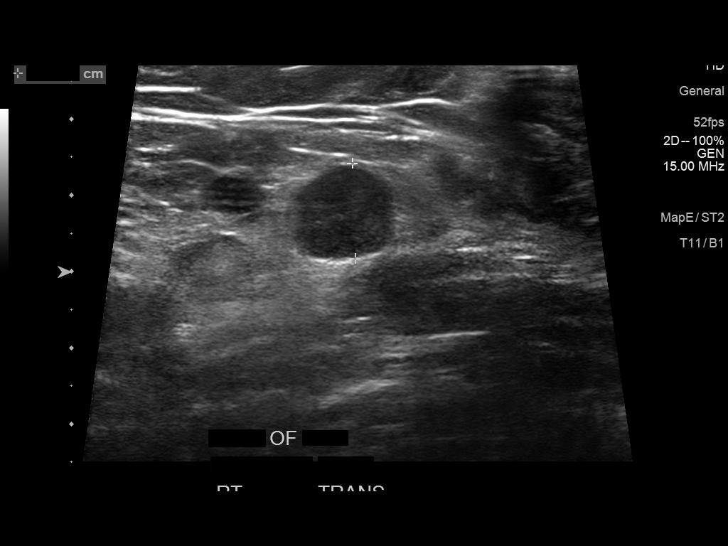
[im 8/13]
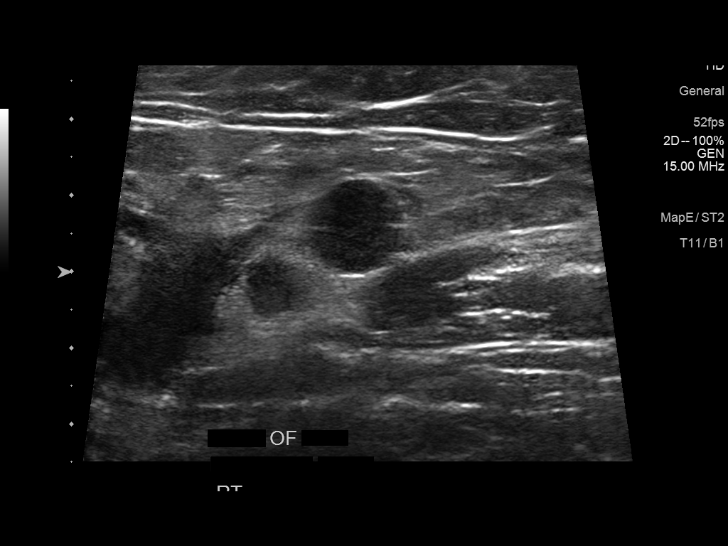
[im 9/13]
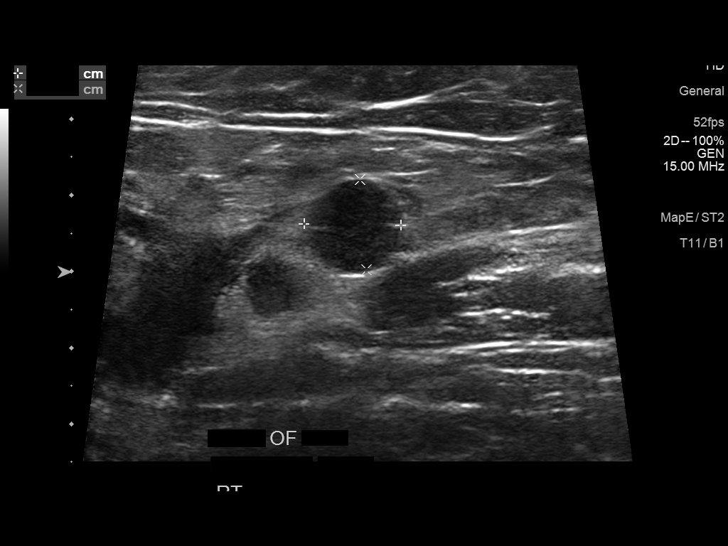
[im 10/13]
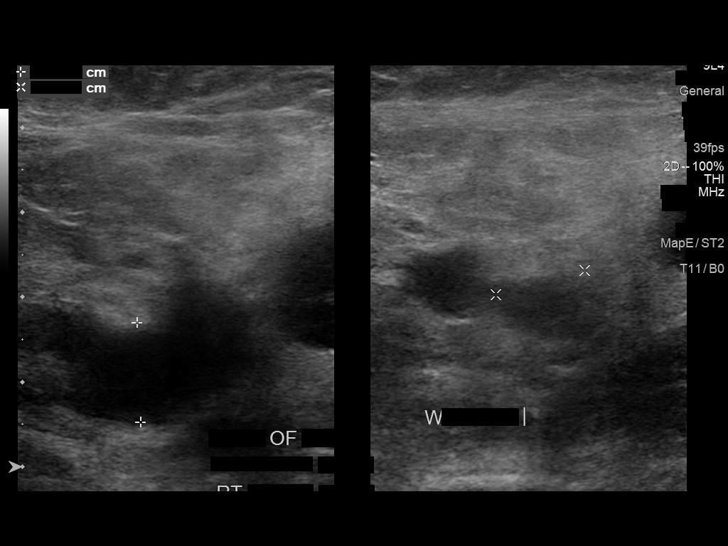
[im 11/13]
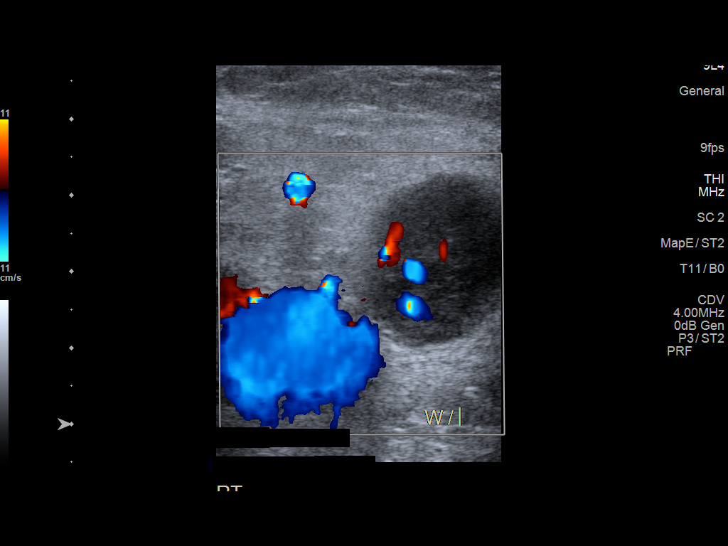
[im 12/13]
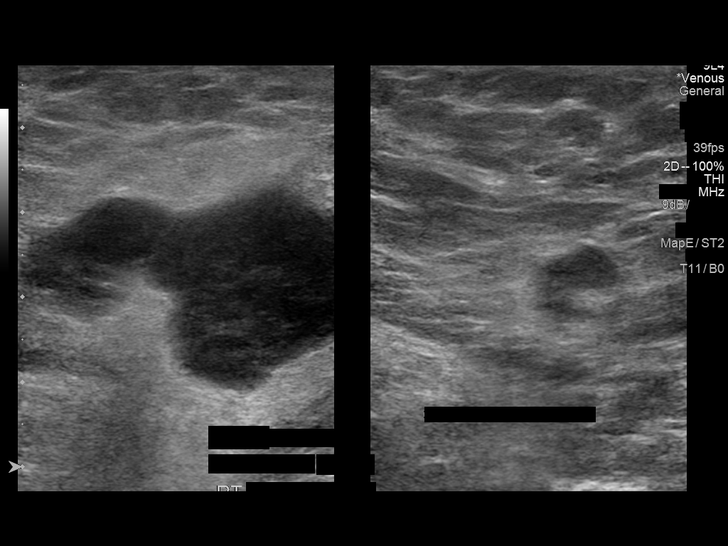
[im 13/13]
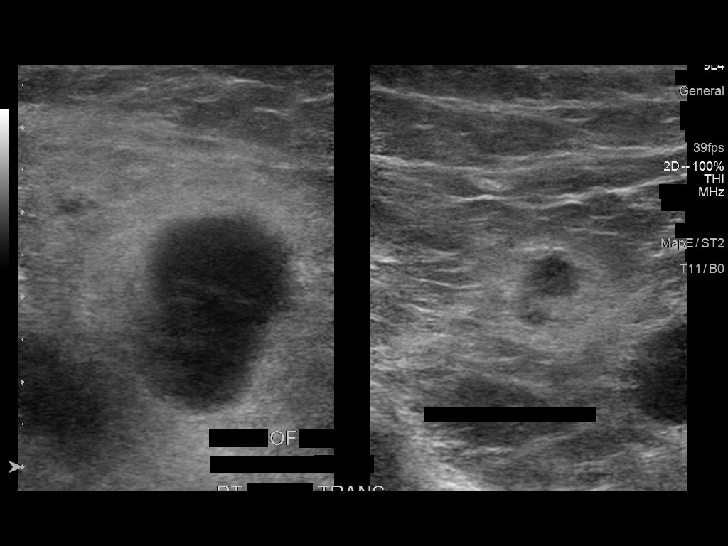

[13 of 13 positions shown; findings below may reference images not displayed]

FINDINGS: Targeted ultrasound of the right groin performed in the region of
palpable mass and pain. Multiple prominent lymph nodes are noted in
the region. Dominant enlarged lymph node with loss of fatty hilus
measuring up to 2.3 cm. Increased echogenicity within the fat of the
right inguinal region
IMPRESSION: 1. In the region of palpable concern are enlarged lymph nodes
measuring up to 2.3 cm with loss of fatty hilus. Differential
considerations include reactive nodes, infectious or inflammatory
process, or potential lymphoproliferative disease. Increased
echogenicity/probable edema within the fat surrounding the lymph
nodes suggests infectious or inflammatory process. Further
management will need to be based on clinical grounds.

## 2020-06-02 DIAGNOSIS — Z20822 Contact with and (suspected) exposure to covid-19: Secondary | ICD-10-CM | POA: Diagnosis not present

## 2020-06-02 DIAGNOSIS — U071 COVID-19: Secondary | ICD-10-CM | POA: Diagnosis not present

## 2020-06-17 ENCOUNTER — Other Ambulatory Visit: Payer: Self-pay

## 2020-06-17 ENCOUNTER — Encounter: Payer: Self-pay | Admitting: Emergency Medicine

## 2020-06-17 ENCOUNTER — Ambulatory Visit
Admission: EM | Admit: 2020-06-17 | Discharge: 2020-06-17 | Disposition: A | Discharge status: Home / Self Care | Payer: BC Managed Care – PPO | Attending: Sports Medicine | Admitting: Sports Medicine

## 2020-06-17 DIAGNOSIS — J069 Acute upper respiratory infection, unspecified: Secondary | ICD-10-CM | POA: Diagnosis not present

## 2020-06-17 DIAGNOSIS — J029 Acute pharyngitis, unspecified: Secondary | ICD-10-CM | POA: Diagnosis not present

## 2020-06-17 NOTE — ED Provider Notes (Signed)
[facility]    CSN: [id number] Arrival date & time: 06/17/20  1241      History   Chief Complaint Chief Complaint  Patient presents with  . Sore Throat    HPI Shelly Harris is a 20 y.o. female.   Pleasant 20 year old female who presents for evaluation of the above issues.  She is concerned because she was diagnosed with COVID in late December and was in quarantine which ended on June 05, 2020.  She presents today with 3 days of sore throat, postnasal drip, nonproductive cough, some ear pressure.  She denies any headaches.  No chest pain or shortness of breath.  No myalgias.  No abdominal pain or urinary symptoms.  She has not been vaccinated against COVID or influenza.  She works over in Engineering geologist at [employer] and is exposed to [employer] but does not know whether she has been recently exposed to COVID.  Concerning is that her sore throat is somewhat debilitating and that she says "I noted some white spots on my throat when I looked".  No red flag signs or symptoms elicited on history.     Past Medical History:  Diagnosis Date  . Anxiety   . Chlamydia 02/2019  . GERD (gastroesophageal reflux disease)   . Orthodontics    Invisalign  . RSV (respiratory syncytial virus infection)    as infant  . Stomach ulcer     Patient Active Problem List   Diagnosis Date Noted  . Abnormal weight gain 07/04/2019  . Anxiety and depression 03/25/2019    Past Surgical History:  Procedure Laterality Date  . DENTAL REHABILITATION     age 32  . TONSILLECTOMY AND ADENOIDECTOMY Bilateral 02/07/2017   Procedure: TONSILLECTOMY;  Surgeon: Geanie Logan, MD;  Location: [facility];  Service: ENT;  Laterality: Bilateral;    OB History    Gravida  0   Para  0   Term  0   Preterm  0   AB  0   Living  0     SAB  0   IAB  0   Ectopic  0   Multiple  0   Live Births  0            Home Medications    Prior to Admission medications   Medication Sig  Start Date End Date Taking? Authorizing Provider  citalopram (CELEXA) 20 MG tablet Take 1.5 tablets (30 mg total) by mouth daily. 05/14/19  Yes Copland, Ilona Sorrel, PA-C  drospirenone-ethinyl estradiol (YAZ) 3-0.02 MG tablet Take 1 tablet by mouth daily. 03/25/19  Yes Copland, Ilona Sorrel, PA-C    Family History Family History  Problem Relation Age of Onset  . Kidney disease Mother   . Cancer Father   . Breast cancer Neg Hx   . Ovarian cancer Neg Hx     Social History Social History   Tobacco Use  . Smoking status: Never Smoker  . Smokeless tobacco: Never Used  Vaping Use  . Vaping Use: Every day  Substance Use Topics  . Alcohol use: Never  . Drug use: Never     Allergies   Patient has no known allergies.   Review of Systems Review of Systems  Constitutional: Negative for activity change, appetite change, chills, diaphoresis, fatigue and fever.  HENT: Positive for ear pain, postnasal drip and sore throat. Negative for congestion, ear discharge, rhinorrhea, sinus pressure and sinus pain.   Eyes: Negative for pain.  Respiratory: Positive  for cough. Negative for apnea, chest tightness, shortness of breath, wheezing and stridor.   Cardiovascular: Negative for chest pain and palpitations.  Gastrointestinal: Negative for abdominal pain, constipation, diarrhea, nausea and vomiting.  Genitourinary: Negative for dysuria.  Musculoskeletal: Negative for myalgias.  Skin: Negative for color change, pallor, rash and wound.  Neurological: Negative for dizziness, tremors, syncope, weakness, light-headedness and headaches.  All other systems reviewed and are negative.    Physical Exam Triage Vital Signs ED Triage Vitals  Enc Vitals Group     BP 06/17/20 1405 108/62     Pulse Rate 06/17/20 1405 68     Resp 06/17/20 1405 16     Temp 06/17/20 1405 98.4 F (36.9 C)     Temp Source 06/17/20 1405 Oral     SpO2 06/17/20 1405 99 %     Weight 06/17/20 1402 215 lb (97.5 kg)     Height  06/17/20 1402 5\' 5"  (1.651 m)     Head Circumference --      Peak Flow --      Pain Score 06/17/20 1402 4     Pain Loc --      Pain Edu? --      Excl. in GC? --    No data found.  Updated Vital Signs BP 108/62 (BP Location: Left Arm)   Pulse 68   Temp 98.4 F (36.9 C) (Oral)   Resp 16   Ht 5\' 5"  (1.651 m)   Wt 97.5 kg   LMP 05/29/2020 (Exact Date)   SpO2 99%   BMI 35.78 kg/m   Visual Acuity Right Eye Distance:   Left Eye Distance:   Bilateral Distance:    Right Eye Near:   Left Eye Near:    Bilateral Near:     Physical Exam   UC Treatments / Results  Labs (all labs ordered are listed, but only abnormal results are displayed) Labs Reviewed  CULTURE, GROUP A STREP [facility])    EKG   Radiology No results found.  Procedures Procedures (including critical care time)  Medications Ordered in UC Medications - No data to display  Initial Impression / Assessment and Plan / UC Course  I have reviewed the triage vital signs and the nursing notes.  Pertinent labs & imaging results that were available during my care of the patient were reviewed by me and considered in my medical decision making (see chart for details).  Clinical impression 20 year old female with several days of sore throat postnasal drip cough and ear pressure.  She has recently been diagnosed with COVID and ended her quarantine.  There is some concern because her throat is quite symptomatic whether or not she has strep throat.  Treatment plan: 1.  The findings and treatment plan were discussed in detail with the patient.  Patient was in agreement. 2.  We will go ahead and send a strep throat.  We are out of the rapid test so we will send off the culture to the hospital.  We will hold on antibiotics pending the results.  If she is positive someone will call in an antibiotic.  If she is negative then is probably a viral process and she will just continue to watch it with supportive care. 3.  Educational  handout was provided. 4.  Over-the-counter meds as needed.  Tylenol or Motrin for fever or discomfort.  Over-the-counter cough medicine for her cough.  Throat lozenges for sore throat. 5.  Gave her a work note just saying  she was seen today.  No reason to hold her out of work. 6.  Plenty of rest, plenty of fluids. 7.  Follow-up here as needed.    Final Clinical Impressions(s) / UC Diagnoses   Final diagnoses:  Pharyngitis, unspecified etiology  Viral URI with cough     Discharge Instructions     We will go ahead and send a strep throat.  We are out of the rapid test so we will send off the culture to the hospital.  We will hold on antibiotics pending the results.  If she is positive someone will call in an antibiotic.  If she is negative then is probably a viral process and she will just continue to watch it with supportive care. Educational handout was provided. Over-the-counter meds as needed.  Tylenol or Motrin for fever or discomfort.  Over-the-counter cough medicine for her cough.  Throat lozenges for sore throat. Gave her a work note just saying she was seen today.  No reason to hold her out of work. Plenty of rest, plenty of fluids. Follow-up here as needed.    ED Prescriptions    None     PDMP not reviewed this encounter.   Delton See, MD 06/17/20 1527

## 2020-06-17 NOTE — ED Triage Notes (Signed)
Dx with Covid on 12/27./21. She now has sore throat, nasal congestion and headache x 3 days. Denies fever.

## 2020-06-17 NOTE — Discharge Instructions (Addendum)
We will go ahead and send a strep throat.  We are out of the rapid test so we will send off the culture to the hospital.  We will hold on antibiotics pending the results.  If she is positive someone will call in an antibiotic.  If she is negative then is probably a viral process and she will just continue to watch it with supportive care. Educational handout was provided. Over-the-counter meds as needed.  Tylenol or Motrin for fever or discomfort.  Over-the-counter cough medicine for her cough.  Throat lozenges for sore throat. Gave her a work note just saying she was seen today.  No reason to hold her out of work. Plenty of rest, plenty of fluids. Follow-up here as needed.

## 2020-06-20 ENCOUNTER — Ambulatory Visit: Payer: Self-pay

## 2020-06-20 LAB — CULTURE, GROUP A STREP (THRC)

## 2020-07-23 ENCOUNTER — Ambulatory Visit: Payer: BC Managed Care – PPO

## 2020-09-08 ENCOUNTER — Encounter: Payer: Self-pay | Admitting: Nurse Practitioner

## 2020-09-08 ENCOUNTER — Other Ambulatory Visit: Payer: Self-pay

## 2020-09-08 ENCOUNTER — Ambulatory Visit: Payer: BC Managed Care – PPO | Admitting: Nurse Practitioner

## 2020-09-08 VITALS — BP 133/89 | HR 69 | Temp 98.4°F | Ht 64.5 in | Wt 275.2 lb

## 2020-09-08 DIAGNOSIS — Z7689 Persons encountering health services in other specified circumstances: Secondary | ICD-10-CM | POA: Diagnosis not present

## 2020-09-08 DIAGNOSIS — F32A Depression, unspecified: Secondary | ICD-10-CM

## 2020-09-08 DIAGNOSIS — F419 Anxiety disorder, unspecified: Secondary | ICD-10-CM | POA: Diagnosis not present

## 2020-09-08 DIAGNOSIS — F321 Major depressive disorder, single episode, moderate: Secondary | ICD-10-CM | POA: Diagnosis not present

## 2020-09-08 MED ORDER — CITALOPRAM HYDROBROMIDE 40 MG PO TABS: 40.0000 mg | ORAL_TABLET | Freq: Every day | ORAL | 1 refills | Status: DC

## 2020-09-08 MED ORDER — BUPROPION HCL ER (SR) 150 MG PO TB12: 150.0000 mg | ORAL_TABLET | Freq: Two times a day (BID) | ORAL | 0 refills | Status: DC

## 2020-09-08 MED ORDER — OMEPRAZOLE 20 MG PO CPDR: 20.0000 mg | DELAYED_RELEASE_CAPSULE | Freq: Every day | ORAL | 3 refills | Status: DC

## 2020-09-08 NOTE — Assessment & Plan Note (Signed)
PHQ 9 score is 18 today. She states that she feels like her depression is worse than her anxiety currently. Will start wellbutrin XL 150mg  BID and continue celexa at 40mg  daily. Currently denies SI/HI, however if develops, call office or go to ER immediately. Discussed counseling, however is not interested at this time. Will follow-up in 4 weeks.

## 2020-09-08 NOTE — Patient Instructions (Signed)
It was great to see you!  Continue your celexa and start wellbutrin. We will follow-up in 4 weeks. You can look at over eater's anonymous, using apps to track calories like myfitness pal. If interested, I can refer you to the weight loss clinic in [place].   Let's follow-up in 4 weeks, sooner if you have concerns.  If a referral was placed today, you will be contacted for an appointment. Please note that routine referrals can sometimes take up to 3-4 weeks to process. Please call our office if you haven't heard anything after this time frame.  Take care,  Rodman Pickle, NP   http://APA.org/depression-guideline"> https://clinicalkey.com"> http://point-of-care.elsevierperformancemanager.com/skills/"> http://point-of-care.elsevierperformancemanager.com">  Managing Depression, Adult Depression is a mental health condition that affects your thoughts, feelings, and actions. Being diagnosed with depression can bring you relief if you did not know why you have felt or behaved a certain way. It could also leave you feeling overwhelmed with uncertainty about your future. Preparing yourself to manage your symptoms can help you feel more positive about your future. How to manage lifestyle changes Managing stress Stress is your body's reaction to life changes and events, both good and bad. Stress can add to your feelings of depression. Learning to manage your stress can help lessen your feelings of depression. Try some of the following approaches to reducing your stress (stress reduction techniques):  Listen to music that you enjoy and that inspires you.  Try using a meditation app or take a meditation class.  Develop a practice that helps you connect with your spiritual self. Walk in nature, pray, or go to a place of worship.  Do some deep breathing. To do this, inhale slowly through your nose. Pause at the top of your inhale for a few seconds and then exhale slowly, letting your muscles  relax.  Practice yoga to help relax and work your muscles. Choose a stress reduction technique that suits your lifestyle and personality. These techniques take time and practice to develop. Set aside 5-15 minutes a day to do them. Therapists can offer training in these techniques. Other things you can do to manage stress include:  Keeping a stress diary.  Knowing your limits and saying no when you think something is too much.  Paying attention to how you react to certain situations. You may not be able to control everything, but you can change your reaction.  Adding humor to your life by watching funny films or TV shows.  Making time for activities that you enjoy and that relax you.   Medicines Medicines, such as antidepressants, are often a part of treatment for depression.  Talk with your pharmacist or health care provider about all the medicines, supplements, and herbal products that you take, their possible side effects, and what medicines and other products are safe to take together.  Make sure to report any side effects you may have to your health care provider. Relationships Your health care provider may suggest family therapy, couples therapy, or individual therapy as part of your treatment. How to recognize changes Everyone responds differently to treatment for depression. As you recover from depression, you may start to:  Have more interest in doing activities.  Feel less hopeless.  Have more energy.  Overeat less often, or have a better appetite.  Have better mental focus. It is important to recognize if your depression is not getting better or is getting worse. The symptoms you had in the beginning may return, such as:  Tiredness (fatigue) or low energy.  Eating too much or too little.  Sleeping too much or too little.  Feeling restless, agitated, or hopeless.  Trouble focusing or making decisions.  Unexplained physical complaints.  Feeling irritable,  angry, or aggressive. If you or your family members notice these symptoms coming back, let your health care provider know right away. Follow these instructions at home: Activity  Try to get some form of exercise each day, such as walking, biking, swimming, or lifting weights.  Practice stress reduction techniques.  Engage your mind by taking a class or doing some volunteer work.   Lifestyle  Get the right amount and quality of sleep.  Cut down on using caffeine, tobacco, alcohol, and other potentially harmful substances.  Eat a healthy diet that includes plenty of vegetables, fruits, whole grains, low-fat dairy products, and lean protein. Do not eat a lot of foods that are high in solid fats, added sugars, or salt (sodium). General instructions  Take over-the-counter and prescription medicines only as told by your health care provider.  Keep all follow-up visits as told by your health care provider. This is important. Where to find support Talking to others Friends and family members can be sources of support and guidance. Talk to trusted friends or family members about your condition. Explain your symptoms to them, and let them know that you are working with a health care provider to treat your depression. Tell friends and family members how they also can be helpful.   Finances  Find appropriate mental health providers that fit with your financial situation.  Talk with your health care provider about options to get reduced prices on your medicines. Where to find more information You can find support in your area from:  Anxiety and Depression Association of America (ADAA): www.adaa.org  Mental Health America: www.mentalhealthamerica.net  [employer] on Mental Illness: www.nami.org Contact a health care provider if:  You stop taking your antidepressant medicines, and you have any of these symptoms: ? Nausea. ? Headache. ? Light-headedness. ? Chills and body  aches. ? Not being able to sleep (insomnia).  You or your friends and family think your depression is getting worse. Get help right away if:  You have thoughts of hurting yourself or others. If you ever feel like you may hurt yourself or others, or have thoughts about taking your own life, get help right away. Go to your nearest emergency department or:  Call your local emergency services (911 in the [place]  Call a suicide crisis helpline, such as the National Suicide Prevention Lifeline at (503)570-8839. This is open 24 hours a day in the [place]  Text the Crisis Text Line at (702)031-9134 (in the [place] Summary  If you are diagnosed with depression, preparing yourself to manage your symptoms is a good way to feel positive about your future.  Work with your health care provider on a management plan that includes stress reduction techniques, medicines (if applicable), therapy, and healthy lifestyle habits.  Keep talking with your health care provider about how your treatment is working.  If you have thoughts about taking your own life, call a suicide crisis helpline or text a crisis text line. This information is not intended to replace advice given to you by your health care provider. Make sure you discuss any questions you have with your health care provider. Document Revised: 03/27/2019 Document Reviewed: 03/27/2019 Elsevier Patient Education  2021 [employer].

## 2020-09-08 NOTE — Assessment & Plan Note (Addendum)
Anxiety had been well controlled on 30mg  daily, however she started to feel like it wasn't helping as much as it had and increased herself to 40mg  daily. GAD 7 today is 19. Will continue celexa 40mg  daily and add on wellbutrin xl 150mg  BID. Follow-up in 4 weeks.

## 2020-09-08 NOTE — Assessment & Plan Note (Signed)
BMI 46.5. This is currently her highest weight. She started gaining weight when she started sertraline and then increased significantly in the last year. Discussed diet and exercise. Adding wellbutrin to current anxiety and depression treatment to see help and see if mood is affecting her diet/binging. Discussed counting calories and overeaters anonymous. Also discussed referral to weight loss clinic in [place]. Goal is to lose 1-2 pounds per week. Will follow-up in 4 weeks.
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
## 2020-10-06 ENCOUNTER — Ambulatory Visit: Payer: BC Managed Care – PPO | Admitting: Nurse Practitioner

## 2020-10-08 ENCOUNTER — Other Ambulatory Visit: Payer: Self-pay | Admitting: Nurse Practitioner

## 2020-10-09 ENCOUNTER — Telehealth: Payer: Self-pay

## 2020-10-09 NOTE — Telephone Encounter (Signed)
Lvm to make this apt. 

## 2020-10-09 NOTE — Telephone Encounter (Signed)
Needs an appointment for future refills

## 2020-10-09 NOTE — Telephone Encounter (Signed)
Unable to lvm to make this am VM full

## 2020-10-09 NOTE — Telephone Encounter (Signed)
-----   Message from Gerre Scull, NP sent at 10/09/2020  9:07 AM EDT ----- Regarding: needs appointment I refilled her wellbutrin for 30 days, but she needs a follow-up appointment for future refills. Please schedule her an appointment before 30 days. Thank you.

## 2020-10-09 NOTE — Telephone Encounter (Signed)
Routing to provider  

## 2020-10-09 NOTE — Telephone Encounter (Signed)
Please get patient scheduled for a follow up 

## 2020-10-13 NOTE — Telephone Encounter (Signed)
Lvm to make this apt. 

## 2020-10-14 ENCOUNTER — Encounter: Payer: Self-pay | Admitting: Nurse Practitioner

## 2020-10-14 NOTE — Telephone Encounter (Signed)
Sent letter

## 2020-10-14 NOTE — Telephone Encounter (Signed)
Lvm to make this apt. 

## 2020-11-19 ENCOUNTER — Other Ambulatory Visit: Payer: Self-pay | Admitting: Nurse Practitioner

## 2020-11-19 NOTE — Telephone Encounter (Signed)
   Notes to clinic: Patient no showed last appt Note states that patient needs follow up on medication    Requested Prescriptions  Pending Prescriptions Disp Refills   citalopram (CELEXA) 40 MG tablet [Pharmacy Med Name: CITALOPRAM 40MG  TABLETS] 30 tablet 1    Sig: TAKE 1 TABLET(40 MG) BY MOUTH DAILY      Psychiatry:  Antidepressants - SSRI Passed - 11/19/2020 10:06 AM      Passed - Completed PHQ-2 or PHQ-9 in the last 360 days      Passed - Valid encounter within last 6 months    Recent Outpatient Visits           2 months ago Depression, major, single episode, moderate (HCC)   [facility] McElwee, Lauren A, NP                  buPROPion (WELLBUTRIN SR) 150 MG 12 hr tablet [Pharmacy Med Name: BUPROPION SR 150MG  TABLETS (12 H)] 60 tablet 0    Sig: TAKE 1 TABLET(150 MG) BY MOUTH TWICE DAILY      Psychiatry: Antidepressants - bupropion Passed - 11/19/2020 10:06 AM      Passed - Completed PHQ-2 or PHQ-9 in the last 360 days      Passed - Last BP in normal range    BP Readings from Last 1 Encounters:  09/08/20 133/89          Passed - Valid encounter within last 6 months    Recent Outpatient Visits           2 months ago Depression, major, single episode, moderate (HCC)   [facility] McElwee, 11/21/2020, NP

## 2020-11-19 NOTE — Telephone Encounter (Signed)
Pt has apt on 11/20/2020 

## 2020-11-20 ENCOUNTER — Telehealth: Payer: Self-pay

## 2020-11-20 ENCOUNTER — Ambulatory Visit: Payer: BC Managed Care – PPO | Admitting: Nurse Practitioner

## 2020-11-20 NOTE — Telephone Encounter (Signed)
Unable to reschedule apt as vm was full

## 2020-11-20 NOTE — Telephone Encounter (Signed)
30 day refill given last month until can schedule a visit. She had an appointment today but cancelled/no showed same day.

## 2020-11-20 NOTE — Telephone Encounter (Signed)
-----   Message from Gerre Scull, NP sent at 11/20/2020 10:16 AM EDT ----- She asked for a refill on 2 medications. Last month I sent in a 30 day refill saying that she wouldn't get anymore until she saw me for follow-up. She no-showed/late cancelled today and is asking for a refill. Please re-schedule her an appointment with me ASAP and let her know I will send in a refill for that many days of medications.

## 2020-11-23 NOTE — Telephone Encounter (Signed)
Lvm to make this apt. 

## 2020-11-25 ENCOUNTER — Encounter: Payer: Self-pay | Admitting: Nurse Practitioner

## 2020-11-25 NOTE — Telephone Encounter (Signed)
Lvm to make this apt 3 attempts made sent letter

## 2020-11-26 ENCOUNTER — Encounter: Payer: Self-pay | Admitting: Nurse Practitioner

## 2020-11-26 ENCOUNTER — Other Ambulatory Visit: Payer: Self-pay | Admitting: Nurse Practitioner

## 2020-11-26 ENCOUNTER — Telehealth: Payer: Self-pay | Admitting: Nurse Practitioner

## 2020-11-26 ENCOUNTER — Ambulatory Visit (INDEPENDENT_AMBULATORY_CARE_PROVIDER_SITE_OTHER): Payer: BC Managed Care – PPO | Admitting: Nurse Practitioner

## 2020-11-26 ENCOUNTER — Telehealth: Payer: Self-pay

## 2020-11-26 DIAGNOSIS — F321 Major depressive disorder, single episode, moderate: Secondary | ICD-10-CM | POA: Diagnosis not present

## 2020-11-26 MED ORDER — SAXENDA 18 MG/3ML ~~LOC~~ SOPN: PEN_INJECTOR | SUBCUTANEOUS | 0 refills | Status: AC

## 2020-11-26 NOTE — Telephone Encounter (Signed)
PA for Saxenda initiated and submitted via Cover My Meds. Key: [id number]

## 2020-11-26 NOTE — Telephone Encounter (Signed)
Copied from CRM [id number]. Topic: Quick Communication - Rx Refill/Question >> Nov 26, 2020  2:42 PM Jaquita Rector A wrote: Medication: buPROPion (WELLBUTRIN SR) 150 MG 12 hr tablet, citalopram (CELEXA) 40 MG tablet  Out of meds for a few days  Has the patient contacted their pharmacy? Yes.   (Agent: If no, request that the patient contact the pharmacy for the refill.) (Agent: If yes, when and what did the pharmacy advise?)  Preferred Pharmacy (with phone number or street name): [id number] [place], [place] - [address] AT [facility] OF [address] [facility]  Phone:  [phone] Fax:  [phone]     Agent: Please be advised that RX refills may take up to 3 business days. We ask that you follow-up with your pharmacy.

## 2020-11-26 NOTE — Progress Notes (Signed)
Established Patient Office Visit  Subjective:  Patient ID: Shelly Harris, female    DOB: [birth year]  Age: 20 y.o. MRN: [id number]  CC:  Chief Complaint  Patient presents with   Depression    HPI Shelly Harris presents for follow-up depression  DEPRESSION  Mood status: controlled Satisfied with current treatment?: yes Symptom severity: mild  Duration of current treatment : chronic Side effects: no Medication compliance: excellent compliance Psychotherapy/counseling: no  Previous psychiatric medications: celexa and wellbutrin Depressed mood: no Anxious mood: no Anhedonia: no Significant weight loss or gain: no Insomnia: no  Fatigue: no Feelings of worthlessness or guilt: no Impaired concentration/indecisiveness: no Suicidal ideations: no Hopelessness: no Crying spells: no Depression screen [facility] 2/9 11/26/2020 09/08/2020 05/14/2019 02/18/2019 01/17/2019  Decreased Interest 0 3 0 0 2  Down, Depressed, Hopeless 1 3 1 1 3   PHQ - 2 Score 1 6 1 1 5   Altered sleeping 0 3 1 0 2  Tired, decreased energy 0 3 0 0 3  Change in appetite 3 3 1 2 3   Feeling bad or failure about yourself  1 2 1  0 3  Trouble concentrating 0 0 0 0 1  Moving slowly or fidgety/restless 0 1 0 0 1  Suicidal thoughts 0 0 0 0 0  PHQ-9 Score 5 18 4 3 18   Difficult doing work/chores Not difficult at all Very difficult Somewhat difficult Not difficult at all Very difficult   WEIGHT GAIN  Has attempted to lose weight in the past, she is currently at the highest weight she has been. Was hoping that with starting wellbutrin, it will help with depression and help decrease her appetite. She states that she feels like she has gained more weight since starting the wellbutrin. She is interested in starting a medication to help with weight loss.   Past Medical History:  Diagnosis Date   Anxiety    Chlamydia 02/2019   Depression    GERD (gastroesophageal reflux disease)    Orthodontics    Invisalign   RSV  (respiratory syncytial virus infection)    as infant   Stomach ulcer     Past Surgical History:  Procedure Laterality Date   DENTAL REHABILITATION     age 60   TONSILLECTOMY AND ADENOIDECTOMY Bilateral 02/07/2017   Procedure: TONSILLECTOMY;  Surgeon: , MD;  Location: [facility];  Service: ENT;  Laterality: Bilateral;    Family History  Problem Relation Age of Onset   Kidney disease Mother    Cancer Father    Diabetes Maternal Grandmother    Breast cancer Neg Hx    Ovarian cancer Neg Hx     Social History   Socioeconomic History   Marital status: Single    Spouse name: Not on file   Number of children: Not on file   Years of education: Not on file   Highest education level: Not on file  Occupational History   Not on file  Tobacco Use   Smoking status: Never   Smokeless tobacco: Never  Vaping Use   Vaping Use: Every day  Substance and Sexual Activity   Alcohol use: Never   Drug use: Never   Sexual activity: Yes    Birth control/protection: Condom, None  Other Topics Concern   Not on file  Social History Narrative   Not on file   Social Determinants of Health   Financial Resource Strain: Not on file  Food Insecurity: Not on file  Transportation Needs: Not  on file  Physical Activity: Not on file  Stress: Not on file  Social Connections: Not on file  Intimate Partner Violence: Not on file    Outpatient Medications Prior to Visit  Medication Sig Dispense Refill   buPROPion (WELLBUTRIN SR) 150 MG 12 hr tablet Take 1 tablet (150 mg total) by mouth 2 (two) times daily. Need an appointment for future refills 60 tablet 0   citalopram (CELEXA) 40 MG tablet Take 1 tablet (40 mg total) by mouth daily. 30 tablet 1   omeprazole (PRILOSEC) 20 MG capsule Take 1 capsule (20 mg total) by mouth daily. 90 capsule 3   No facility-administered medications prior to visit.    No Known Allergies  ROS Review of Systems  Constitutional: Negative.    Respiratory: Negative.    Cardiovascular: Negative.   Gastrointestinal: Negative.   Genitourinary: Negative.   Neurological: Negative.   Psychiatric/Behavioral: Negative.       Objective:    Physical Exam Vitals and nursing note reviewed.  Pulmonary:     Comments: Able to talk in complete sentences Neurological:     Mental Status: She is alert and oriented to person, place, and time.  Psychiatric:        Thought Content: Thought content normal.    Temp 98.7 F (37.1 C) (Oral)  Wt Readings from Last 3 Encounters:  09/08/20 275 lb 3.2 oz (124.8 kg)  06/17/20 215 lb (97.5 kg)  07/25/19 253 lb (114.8 kg) (>99 %, Z= 2.52)*   * Growth percentiles are based on CDC (Girls, 2-20 Years) data.     Health Maintenance Due  Topic Date Due   HPV VACCINES (1 - 2-dose series) Never done   TETANUS/TDAP  Never done   CHLAMYDIA SCREENING  05/13/2020       Topic Date Due   HPV VACCINES (1 - 2-dose series) Never done    Lab Results  Component Value Date   TSH 2.380 07/04/2019   No results found for: WBC, HGB, HCT, MCV, PLT Lab Results  Component Value Date   NA 138 07/04/2019   K 4.3 07/04/2019   CO2 22 07/04/2019   GLUCOSE 84 07/04/2019   BUN 10 07/04/2019   CREATININE 0.82 07/04/2019   BILITOT 0.6 07/04/2019   ALKPHOS 78 07/04/2019   AST 20 07/04/2019   ALT 21 07/04/2019   PROT 7.2 07/04/2019   ALBUMIN 4.6 07/04/2019   CALCIUM 9.6 07/04/2019   No results found for: CHOL No results found for: HDL No results found for: LDLCALC No results found for: TRIG No results found for: CHOLHDL Lab Results  Component Value Date   HGBA1C 5.0 07/04/2019      Assessment & Plan:   Problem List Items Addressed This Visit       Other   Depression, major, single episode, moderate (HCC)    Symptoms have improved significantly with starting wellbutrin. Her PHQ-9 score went from 18 to 5. She is not having any side effects. Denies SI/HI. Will continue celexa and wellbutrin.  Refills sent to the pharmacy. Follow-up in 6 months.        Morbid obesity (HCC) - Primary    Discussed lifestyle modifications including limiting portion sizes, increasing the amount of protein, fruits and vegetables in her diet. She is interested in medication. Will start saxenda daily. Call with any concerns/side effects. Goal is to lose 1-2 pounds per week. Follow-up in 1 month.       Relevant Medications   Liraglutide -Weight  Management (SAXENDA) 18 MG/3ML SOPN    Meds ordered this encounter  Medications   Liraglutide -Weight Management (SAXENDA) 18 MG/3ML SOPN    Sig: Inject 0.6 mg into the skin daily for 7 days, THEN 1.2 mg daily for 7 days, THEN 1.8 mg daily for 7 days, THEN 2.4 mg daily for 7 days, THEN 3 mg daily.    Dispense:  22 mL    Refill:  0    Follow-up: No follow-ups on file.    This visit was completed via telephone due to the restrictions of the COVID-19 pandemic. All issues as above were discussed and addressed but no physical exam was performed. If it was felt that the patient should be evaluated in the office, they were directed there. The patient verbally consented to this visit. Patient was unable to complete an audio/visual visit due to Technical difficulties", "Lack of internet. Due to the catastrophic nature of the COVID-19 pandemic, this visit was done through audio contact only. Location of the patient: home Location of the provider: work Those involved with this call:  Provider: Alene Mires, DNP CMA: Wilhemena Durie, CMA Front Desk/Registration: Harriet Pho  Time spent on call:  15 minutes on the phone discussing health concerns. 10 minutes total spent in review of patient's record and preparation of their chart.   Gerre Scull, NP

## 2020-11-26 NOTE — Assessment & Plan Note (Signed)
Discussed lifestyle modifications including limiting portion sizes, increasing the amount of protein, fruits and vegetables in her diet. She is interested in medication. Will start saxenda daily. Call with any concerns/side effects. Goal is to lose 1-2 pounds per week. Follow-up in 1 month.

## 2020-11-26 NOTE — Assessment & Plan Note (Addendum)
Symptoms have improved significantly with starting wellbutrin. Her PHQ-9 score went from 18 to 5. She is not having any side effects. Denies SI/HI. Will continue celexa and wellbutrin. Refills sent to the pharmacy. Follow-up in 6 months.

## 2020-12-01 NOTE — Telephone Encounter (Signed)
PA approved. Called and LVM notifying patient of approval.  

## 2021-01-04 ENCOUNTER — Ambulatory Visit
Admission: RE | Admit: 2021-01-04 | Discharge: 2021-01-04 | Disposition: A | Discharge status: Home / Self Care | Payer: BC Managed Care – PPO | Source: Ambulatory Visit | Attending: Emergency Medicine | Admitting: Emergency Medicine

## 2021-01-04 ENCOUNTER — Other Ambulatory Visit: Payer: Self-pay

## 2021-01-04 VITALS — BP 130/86 | HR 78 | Temp 98.3°F | Resp 18

## 2021-01-04 DIAGNOSIS — J029 Acute pharyngitis, unspecified: Secondary | ICD-10-CM | POA: Diagnosis not present

## 2021-01-04 DIAGNOSIS — J069 Acute upper respiratory infection, unspecified: Secondary | ICD-10-CM | POA: Diagnosis not present

## 2021-01-04 HISTORY — DX: Other seasonal allergic rhinitis: J30.2

## 2021-01-04 LAB — POCT RAPID STREP A (OFFICE): Rapid Strep A Screen: NEGATIVE

## 2021-01-04 NOTE — Discharge Instructions (Addendum)
Your rapid strep test is negative.    Take Tylenol or ibuprofen as needed for discomfort.    Follow-up with your primary care provider if your symptoms are not improving.       

## 2021-01-04 NOTE — ED Triage Notes (Addendum)
Patient presents to Urgent Care with complaints of sore throat, sneezing, and nasal drainage x 5 days. Treating symptoms with tylenol and throat spray.   Denies fever.

## 2021-01-04 NOTE — ED Provider Notes (Signed)
Renaldo Fiddler    CSN: [id number] Arrival date & time: 01/04/21  1324      History   Chief Complaint Chief Complaint  Patient presents with   Sore Throat    X 5 days    HPI Shelly Harris is a 20 y.o. female.  Patient presents with 5-day history of sore throat and postnasal drip.  She denies fever, chills, rash, cough, shortness of breath, or other symptoms.  Treatment attempted at home with Tylenol and Chloraseptic throat spray.  Her medical history includes seasonal allergies.  The history is provided by the patient and medical records.   Past Medical History:  Diagnosis Date   Anxiety    Chlamydia 02/2019   Depression    GERD (gastroesophageal reflux disease)    Orthodontics    Invisalign   RSV (respiratory syncytial virus infection)    as infant   Seasonal allergies    Stomach ulcer     Patient Active Problem List   Diagnosis Date Noted   Depression, major, single episode, moderate (HCC) 09/08/2020   Morbid obesity (HCC) 09/08/2020   Abnormal weight gain 07/04/2019   Anxiety and depression 03/25/2019    Past Surgical History:  Procedure Laterality Date   DENTAL REHABILITATION     age 76   TONSILLECTOMY AND ADENOIDECTOMY Bilateral 02/07/2017   Procedure: TONSILLECTOMY;  Surgeon: Geanie Logan, MD;  Location: [facility];  Service: ENT;  Laterality: Bilateral;    OB History     Gravida  0   Para  0   Term  0   Preterm  0   AB  0   Living  0      SAB  0   IAB  0   Ectopic  0   Multiple  0   Live Births  0            Home Medications    Prior to Admission medications   Medication Sig Start Date End Date Taking? Authorizing Provider  buPROPion (WELLBUTRIN SR) 150 MG 12 hr tablet TAKE 1 TABLET BY MOUTH EVERY MORNING FOR 1 WEEK, THEN INCREASE TO 1 TABLET TWICE DAILY THEREAFTER 11/26/20   McElwee, Lauren A, NP  citalopram (CELEXA) 40 MG tablet TAKE 1 TABLET(40 MG) BY MOUTH DAILY 11/26/20   McElwee, Lauren A, NP   Liraglutide -Weight Management (SAXENDA) 18 MG/3ML SOPN Inject 0.6 mg into the skin daily for 7 days, THEN 1.2 mg daily for 7 days, THEN 1.8 mg daily for 7 days, THEN 2.4 mg daily for 7 days, THEN 3 mg daily. 11/26/20 01/23/21  McElwee, Jake Church, NP  omeprazole (PRILOSEC) 20 MG capsule Take 1 capsule (20 mg total) by mouth daily. 09/08/20   McElwee, Jake Church, NP    Family History Family History  Problem Relation Age of Onset   Kidney disease Mother    Cancer Father    Diabetes Maternal Grandmother    Breast cancer Neg Hx    Ovarian cancer Neg Hx     Social History Social History   Tobacco Use   Smoking status: Never   Smokeless tobacco: Never  Vaping Use   Vaping Use: Every day  Substance Use Topics   Alcohol use: Never   Drug use: Never     Allergies   Patient has no known allergies.   Review of Systems Review of Systems  Constitutional:  Negative for chills and fever.  HENT:  Positive for postnasal drip and sore throat. Negative  for ear pain.   Respiratory:  Negative for cough and shortness of breath.   Cardiovascular:  Negative for chest pain and palpitations.  Gastrointestinal:  Negative for abdominal pain and vomiting.  Skin:  Negative for color change and rash.  All other systems reviewed and are negative.   Physical Exam Triage Vital Signs ED Triage Vitals  Enc Vitals Group     BP 01/04/21 1342 130/86     Pulse Rate 01/04/21 1342 78     Resp 01/04/21 1342 18     Temp 01/04/21 1342 98.3 F (36.8 C)     Temp Source 01/04/21 1342 Oral     SpO2 01/04/21 1342 96 %     Weight --      Height --      Head Circumference --      Peak Flow --      Pain Score 01/04/21 1350 0     Pain Loc --      Pain Edu? --      Excl. in GC? --    No data found.  Updated Vital Signs BP 130/86 (BP Location: Left Arm)   Pulse 78   Temp 98.3 F (36.8 C) (Oral)   Resp 18   LMP 12/06/2020   SpO2 96%   Visual Acuity Right Eye Distance:   Left Eye Distance:   Bilateral  Distance:    Right Eye Near:   Left Eye Near:    Bilateral Near:     Physical Exam Vitals and nursing note reviewed.  Constitutional:      General: She is not in acute distress.    Appearance: She is well-developed. She is not ill-appearing.  HENT:     Head: Normocephalic and atraumatic.     Right Ear: Tympanic membrane normal.     Left Ear: Tympanic membrane normal.     Nose: Nose normal.     Mouth/Throat:     Mouth: Mucous membranes are moist.     Pharynx: Oropharynx is clear.  Eyes:     Conjunctiva/sclera: Conjunctivae normal.  Cardiovascular:     Rate and Rhythm: Normal rate and regular rhythm.     Heart sounds: Normal heart sounds.  Pulmonary:     Effort: Pulmonary effort is normal. No respiratory distress.     Breath sounds: Normal breath sounds.  Abdominal:     Palpations: Abdomen is soft.     Tenderness: There is no abdominal tenderness.  Musculoskeletal:     Cervical back: Neck supple.  Skin:    General: Skin is warm and dry.  Neurological:     General: No focal deficit present.     Mental Status: She is alert and oriented to person, place, and time.     Gait: Gait normal.  Psychiatric:        Mood and Affect: Mood normal.        Behavior: Behavior normal.     UC Treatments / Results  Labs (all labs ordered are listed, but only abnormal results are displayed) Labs Reviewed  POCT RAPID STREP A (OFFICE)    EKG   Radiology No results found.  Procedures Procedures (including critical care time)  Medications Ordered in UC Medications - No data to display  Initial Impression / Assessment and Plan / UC Course  I have reviewed the triage vital signs and the nursing notes.  Pertinent labs & imaging results that were available during my care of the patient were reviewed by me and  considered in my medical decision making (see chart for details).  Sore throat, viral URI.  Rapid strep negative.  No COVID test indicated because patient had COVID 1 month  ago.  Discussed symptomatic treatment including Tylenol or ibuprofen.  Education provided on sore throats.  Instructed patient to follow-up with her PCP if her symptoms are not improving.  She agrees to plan of care.   Final Clinical Impressions(s) / UC Diagnoses   Final diagnoses:  Sore throat  Viral URI     Discharge Instructions      Your rapid strep test is negative.    Take Tylenol or ibuprofen as needed for discomfort.    Follow up with your primary care provider if your symptoms are not improving.          ED Prescriptions   None    PDMP not reviewed this encounter.   Mickie Bail, NP 01/04/21 [phone]

## 2021-01-22 ENCOUNTER — Other Ambulatory Visit: Payer: Self-pay | Admitting: Nurse Practitioner

## 2021-01-27 ENCOUNTER — Other Ambulatory Visit: Payer: Self-pay | Admitting: Nurse Practitioner

## 2021-01-27 NOTE — Telephone Encounter (Signed)
Requested Prescriptions  Pending Prescriptions Disp Refills  . citalopram (CELEXA) 40 MG tablet [Pharmacy Med Name: CITALOPRAM 40MG  TABLETS] 30 tablet 1    Sig: TAKE 1 TABLET(40 MG) BY MOUTH DAILY     Psychiatry:  Antidepressants - SSRI Passed - 01/27/2021  3:15 AM      Passed - Completed PHQ-2 or PHQ-9 in the last 360 days      Passed - Valid encounter within last 6 months    Recent Outpatient Visits          2 months ago Morbid obesity (HCC)   [facility] McElwee, Lauren A, NP   4 months ago Depression, major, single episode, moderate (HCC)   [facility] McElwee, 01/29/2021, NP

## 2021-02-10 ENCOUNTER — Ambulatory Visit: Payer: BC Managed Care – PPO | Admitting: Obstetrics and Gynecology

## 2021-02-10 NOTE — Progress Notes (Deleted)
    McElwee, [name], NP   No chief complaint on file.   HPI:      [name] is a 20 y.o. G0P0000 whose LMP was No LMP recorded., presents today for *** Neg STD 12/20   Past Medical History:  Diagnosis Date   Anxiety    Chlamydia 02/2019   Depression    GERD (gastroesophageal reflux disease)    Orthodontics    Invisalign   RSV (respiratory syncytial virus infection)    as infant   Seasonal allergies    Stomach ulcer     Past Surgical History:  Procedure Laterality Date   DENTAL REHABILITATION     age 81   TONSILLECTOMY AND ADENOIDECTOMY Bilateral 02/07/2017   Procedure: TONSILLECTOMY;  Surgeon: Geanie Logan, MD;  Location: [facility];  Service: ENT;  Laterality: Bilateral;    Family History  Problem Relation Age of Onset   Kidney disease Mother    Cancer Father    Diabetes Maternal Grandmother    Breast cancer Neg Hx    Ovarian cancer Neg Hx     Social History   Socioeconomic History   Marital status: Single    Spouse name: Not on file   Number of children: Not on file   Years of education: Not on file   Highest education level: Not on file  Occupational History   Not on file  Tobacco Use   Smoking status: Never   Smokeless tobacco: Never  Vaping Use   Vaping Use: Every day  Substance and Sexual Activity   Alcohol use: Never   Drug use: Never   Sexual activity: Yes    Birth control/protection: Condom, None  Other Topics Concern   Not on file  Social History Narrative   Not on file   Social Determinants of Health   Financial Resource Strain: Not on file  Food Insecurity: Not on file  Transportation Needs: Not on file  Physical Activity: Not on file  Stress: Not on file  Social Connections: Not on file  Intimate Partner Violence: Not on file    Outpatient Medications Prior to Visit  Medication Sig Dispense Refill   buPROPion (WELLBUTRIN SR) 150 MG 12 hr tablet TAKE 1 TABLET BY MOUTH EVERY MORNING FOR 1 WEEK. INCREASE  TO 1 TABLET 2 TIMES DAILY THEREAFTER 60 tablet 2   citalopram (CELEXA) 40 MG tablet TAKE 1 TABLET(40 MG) BY MOUTH DAILY 30 tablet 1   omeprazole (PRILOSEC) 20 MG capsule Take 1 capsule (20 mg total) by mouth daily. 90 capsule 3   No facility-administered medications prior to visit.      ROS:  Review of Systems BREAST: No symptoms   OBJECTIVE:   Vitals:  There were no vitals taken for this visit.  Physical Exam  Results: No results found for this or any previous visit (from the past 24 hour(s)).   Assessment/Plan: No diagnosis found.    No orders of the defined types were placed in this encounter.     No follow-ups on file.  Rumor Sun B. Estes Lehner, PA-C 02/10/2021 11:48 AM

## 2021-02-16 ENCOUNTER — Ambulatory Visit: Payer: BC Managed Care – PPO | Admitting: Obstetrics and Gynecology

## 2021-02-16 NOTE — Progress Notes (Deleted)
    McElwee, Shelly Mervyn Skeeters, NP   No chief complaint on file.   HPI:      [name] is a 20 y.o. G0P0000 whose LMP was No LMP recorded., presents today for ***    Past Medical History:  Diagnosis Date   Anxiety    Chlamydia 02/2019   Depression    GERD (gastroesophageal reflux disease)    Orthodontics    Invisalign   RSV (respiratory syncytial virus infection)    as infant   Seasonal allergies    Stomach ulcer     Past Surgical History:  Procedure Laterality Date   DENTAL REHABILITATION     age 52   TONSILLECTOMY AND ADENOIDECTOMY Bilateral 02/07/2017   Procedure: TONSILLECTOMY;  Surgeon: Shelly Logan, MD;  Location: [facility];  Service: ENT;  Laterality: Bilateral;    Family History  Problem Relation Age of Onset   Kidney disease Mother    Cancer Father    Diabetes Maternal Grandmother    Breast cancer Neg Hx    Ovarian cancer Neg Hx     Social History   Socioeconomic History   Marital status: Single    Spouse name: Not on file   Number of children: Not on file   Years of education: Not on file   Highest education level: Not on file  Occupational History   Not on file  Tobacco Use   Smoking status: Never   Smokeless tobacco: Never  Vaping Use   Vaping Use: Every day  Substance and Sexual Activity   Alcohol use: Never   Drug use: Never   Sexual activity: Yes    Birth control/protection: Condom, None  Other Topics Concern   Not on file  Social History Narrative   Not on file   Social Determinants of Health   Financial Resource Strain: Not on file  Food Insecurity: Not on file  Transportation Needs: Not on file  Physical Activity: Not on file  Stress: Not on file  Social Connections: Not on file  Intimate Partner Violence: Not on file    Outpatient Medications Prior to Visit  Medication Sig Dispense Refill   buPROPion (WELLBUTRIN SR) 150 MG 12 hr tablet TAKE 1 TABLET BY MOUTH EVERY MORNING FOR 1 WEEK. INCREASE TO 1 TABLET  2 TIMES DAILY THEREAFTER 60 tablet 2   citalopram (CELEXA) 40 MG tablet TAKE 1 TABLET(40 MG) BY MOUTH DAILY 30 tablet 1   omeprazole (PRILOSEC) 20 MG capsule Take 1 capsule (20 mg total) by mouth daily. 90 capsule 3   No facility-administered medications prior to visit.      ROS:  Review of Systems BREAST: No symptoms   OBJECTIVE:   Vitals:  There were no vitals taken for this visit.  Physical Exam  Results: No results found for this or any previous visit (from the past 24 hour(s)).   Assessment/Plan: No diagnosis found.    No orders of the defined types were placed in this encounter.     No follow-ups on file.  General Wearing B. Vinh Sachs, PA-C 02/16/2021 12:30 PM

## 2021-04-01 ENCOUNTER — Other Ambulatory Visit: Payer: Self-pay | Admitting: Nurse Practitioner

## 2021-04-01 NOTE — Telephone Encounter (Signed)
Patient will need an office visit for further refills. Requested Prescriptions  Pending Prescriptions Disp Refills  . citalopram (CELEXA) 40 MG tablet [Pharmacy Med Name: CITALOPRAM 40MG  TABLETS] 30 tablet 1    Sig: TAKE 1 TABLET(40 MG) BY MOUTH DAILY     Psychiatry:  Antidepressants - SSRI Failed - 04/01/2021  3:14 AM      Failed - Valid encounter within last 6 months    Recent Outpatient Visits          4 months ago Morbid obesity (HCC)   [facility] McElwee, Lauren A, NP   6 months ago Depression, major, single episode, moderate (HCC)   [facility] McElwee, Lauren A, NP             Passed - Completed PHQ-2 or PHQ-9 in the last 360 days

## 2021-04-03 ENCOUNTER — Other Ambulatory Visit: Payer: Self-pay | Admitting: Obstetrics and Gynecology

## 2021-04-03 DIAGNOSIS — F32A Depression, unspecified: Secondary | ICD-10-CM

## 2021-05-21 ENCOUNTER — Ambulatory Visit: Payer: Self-pay

## 2021-05-25 ENCOUNTER — Other Ambulatory Visit: Payer: Self-pay

## 2021-05-25 ENCOUNTER — Ambulatory Visit
Admission: EM | Admit: 2021-05-25 | Discharge: 2021-05-25 | Disposition: A | Discharge status: Home / Self Care | Payer: BC Managed Care – PPO | Attending: Emergency Medicine | Admitting: Emergency Medicine

## 2021-05-25 ENCOUNTER — Encounter: Payer: Self-pay | Admitting: Emergency Medicine

## 2021-05-25 DIAGNOSIS — F419 Anxiety disorder, unspecified: Secondary | ICD-10-CM

## 2021-05-25 MED ORDER — HYDROXYZINE HCL 25 MG PO TABS: 25.0000 mg | ORAL_TABLET | Freq: Four times a day (QID) | ORAL | 0 refills | Status: DC

## 2021-05-25 NOTE — ED Triage Notes (Signed)
Pt here with anxiety exacerbation at night time. Cannot get into regular doctor for 2 weeks. Unable to sleep at night.

## 2021-05-25 NOTE — Discharge Instructions (Addendum)
Take the hydroxyzine as directed.  Do not drive, operate machinery, or drink alcohol with this medication as it may cause drowsiness.    Go to the Emergency Department or Behavioral Health Urgent Care if your symptoms worsen.  [facility] [address], [place] [address]  [phone]    Follow up with your primary care provider tomorrow.

## 2021-05-25 NOTE — ED Provider Notes (Signed)
Renaldo Fiddler    CSN: [id number] Arrival date & time: 05/25/21  1712      History   Chief Complaint Chief Complaint  Patient presents with   Anxiety    HPI Shelly Harris is a 20 y.o. female.  Patient presents with intermittent nighttime anxiety for the past 3-4 months.  The episodes present with feeling of anxiety, thinking of things that can go wrong, palpitations, sleeplessness.  Her symptoms started after she had a miscarriage 4 months ago.  She denies suicidal or homicidal ideation.  She took 1/2 of her mother's Xanax last night which relieved her anxiety.  She is prescribed bupropion and citalopram.  She states she is unable to see her PCP for 2 weeks.  Her medical history includes anxiety, depression, morbid obesity.  LMP 2 weeks ago.    The history is provided by the patient and medical records.   Past Medical History:  Diagnosis Date   Anxiety    Chlamydia 02/2019   Depression    GERD (gastroesophageal reflux disease)    Orthodontics    Invisalign   RSV (respiratory syncytial virus infection)    as infant   Seasonal allergies    Stomach ulcer     Patient Active Problem List   Diagnosis Date Noted   Depression, major, single episode, moderate (HCC) 09/08/2020   Morbid obesity (HCC) 09/08/2020   Abnormal weight gain 07/04/2019   Anxiety and depression 03/25/2019    Past Surgical History:  Procedure Laterality Date   DENTAL REHABILITATION     age 74   TONSILLECTOMY AND ADENOIDECTOMY Bilateral 02/07/2017   Procedure: TONSILLECTOMY;  Surgeon: Geanie Logan, MD;  Location: [facility];  Service: ENT;  Laterality: Bilateral;    OB History     Gravida  0   Para  0   Term  0   Preterm  0   AB  0   Living  0      SAB  0   IAB  0   Ectopic  0   Multiple  0   Live Births  0            Home Medications    Prior to Admission medications   Medication Sig Start Date End Date Taking? Authorizing Provider  hydrOXYzine  (ATARAX) 25 MG tablet Take 1 tablet (25 mg total) by mouth every 6 (six) hours. 05/25/21  Yes Mickie Bail, NP  buPROPion (WELLBUTRIN SR) 150 MG 12 hr tablet TAKE 1 TABLET BY MOUTH EVERY MORNING FOR 1 WEEK. INCREASE TO 1 TABLET 2 TIMES DAILY THEREAFTER 01/22/21   McElwee, Lauren A, NP  citalopram (CELEXA) 40 MG tablet TAKE 1 TABLET(40 MG) BY MOUTH DAILY 04/01/21   McElwee, Lauren A, NP  omeprazole (PRILOSEC) 20 MG capsule Take 1 capsule (20 mg total) by mouth daily. 09/08/20   McElwee, Jake Church, NP    Family History Family History  Problem Relation Age of Onset   Kidney disease Mother    Cancer Father    Diabetes Maternal Grandmother    Breast cancer Neg Hx    Ovarian cancer Neg Hx     Social History Social History   Tobacco Use   Smoking status: Never   Smokeless tobacco: Never  Vaping Use   Vaping Use: Every day  Substance Use Topics   Alcohol use: Never   Drug use: Never     Allergies   Patient has no known allergies.   Review of  Systems Review of Systems  Respiratory:  Negative for cough and shortness of breath.   Cardiovascular:  Positive for palpitations. Negative for chest pain.  Psychiatric/Behavioral:  Positive for sleep disturbance. Negative for hallucinations, self-injury and suicidal ideas. The patient is nervous/anxious. The patient is not hyperactive.   All other systems reviewed and are negative.   Physical Exam Triage Vital Signs ED Triage Vitals  Enc Vitals Group     BP 05/25/21 1726 (!) 142/84     Pulse Rate 05/25/21 1726 98     Resp 05/25/21 1726 20     Temp 05/25/21 1726 98.3 F (36.8 C)     Temp src --      SpO2 05/25/21 1726 98 %     Weight --      Height --      Head Circumference --      Peak Flow --      Pain Score 05/25/21 1729 0     Pain Loc --      Pain Edu? --      Excl. in GC? --    No data found.  Updated Vital Signs BP (!) 142/84    Pulse 98    Temp 98.3 F (36.8 C)    Resp 20    SpO2 98%   Visual Acuity Right Eye  Distance:   Left Eye Distance:   Bilateral Distance:    Right Eye Near:   Left Eye Near:    Bilateral Near:     Physical Exam Vitals and nursing note reviewed.  Constitutional:      General: She is not in acute distress.    Appearance: She is well-developed. She is obese.  Cardiovascular:     Rate and Rhythm: Normal rate and regular rhythm.     Heart sounds: Normal heart sounds.  Pulmonary:     Effort: Pulmonary effort is normal. No respiratory distress.     Breath sounds: Normal breath sounds.  Musculoskeletal:     Cervical back: Neck supple.  Skin:    General: Skin is warm and dry.  Neurological:     General: No focal deficit present.     Mental Status: She is alert and oriented to person, place, and time.  Psychiatric:        Attention and Perception: Attention normal.        Mood and Affect: Mood normal.        Speech: Speech normal.        Behavior: Behavior normal. Behavior is cooperative.        Thought Content: Thought content normal. Thought content does not include homicidal or suicidal ideation. Thought content does not include homicidal or suicidal plan.        Judgment: Judgment normal.     UC Treatments / Results  Labs (all labs ordered are listed, but only abnormal results are displayed) Labs Reviewed - No data to display  EKG   Radiology No results found.  Procedures Procedures (including critical care time)  Medications Ordered in UC Medications - No data to display  Initial Impression / Assessment and Plan / UC Course  I have reviewed the triage vital signs and the nursing notes.  Pertinent labs & imaging results that were available during my care of the patient were reviewed by me and considered in my medical decision making (see chart for details).   Anxiety.  Patient states her anxiety only occurs at night.  She denies suicidal or homicidal ideation.  Treating with hydroxyzine and precautions for drowsiness with this medication discussed.   Discussed that she should go to the ED or behavioral health urgent care if her symptoms worsen.  Address and contact information for behavioral health urgent care given.  Education provided on managing anxiety.  Instructed her to contact her PCP tomorrow for the soonest available appointment.  She agrees to plan of care.      Final Clinical Impressions(s) / UC Diagnoses   Final diagnoses:  Anxiety     Discharge Instructions      Take the hydroxyzine as directed.  Do not drive, operate machinery, or drink alcohol with this medication as it may cause drowsiness.    Go to the Emergency Department or Behavioral Health Urgent Care if your symptoms worsen.  [facility] [address], [place] [address]  [phone]    Follow up with your primary care provider tomorrow.          ED Prescriptions     Medication Sig Dispense Auth. Provider   hydrOXYzine (ATARAX) 25 MG tablet Take 1 tablet (25 mg total) by mouth every 6 (six) hours. 12 tablet Mickie Bail, NP      PDMP not reviewed this encounter.   Mickie Bail, NP 05/25/21 [phone]

## 2021-05-27 ENCOUNTER — Emergency Department
Admission: EM | Admit: 2021-05-27 | Discharge: 2021-05-27 | Disposition: A | Discharge status: Home / Self Care | Payer: BC Managed Care – PPO | Attending: Emergency Medicine | Admitting: Emergency Medicine

## 2021-05-27 ENCOUNTER — Other Ambulatory Visit: Payer: Self-pay

## 2021-05-27 ENCOUNTER — Emergency Department: Payer: BC Managed Care – PPO

## 2021-05-27 ENCOUNTER — Encounter: Payer: Self-pay | Admitting: *Deleted

## 2021-05-27 DIAGNOSIS — R1032 Left lower quadrant pain: Secondary | ICD-10-CM | POA: Diagnosis not present

## 2021-05-27 DIAGNOSIS — O26891 Other specified pregnancy related conditions, first trimester: Secondary | ICD-10-CM | POA: Diagnosis not present

## 2021-05-27 DIAGNOSIS — Z3A08 8 weeks gestation of pregnancy: Secondary | ICD-10-CM | POA: Diagnosis not present

## 2021-05-27 DIAGNOSIS — N9489 Other specified conditions associated with female genital organs and menstrual cycle: Secondary | ICD-10-CM | POA: Insufficient documentation

## 2021-05-27 DIAGNOSIS — R109 Unspecified abdominal pain: Secondary | ICD-10-CM

## 2021-05-27 DIAGNOSIS — Z3A01 Less than 8 weeks gestation of pregnancy: Secondary | ICD-10-CM

## 2021-05-27 LAB — URINALYSIS, ROUTINE W REFLEX MICROSCOPIC
Bacteria, UA: NONE SEEN
Bilirubin Urine: NEGATIVE
Glucose, UA: NEGATIVE mg/dL
Ketones, ur: 5 mg/dL — AB
Nitrite: NEGATIVE
Protein, ur: NEGATIVE mg/dL
Specific Gravity, Urine: 1.016 (ref 1.005–1.030)
pH: 7 (ref 5.0–8.0)

## 2021-05-27 LAB — CBC
HCT: 42.1 % (ref 36.0–46.0)
Hemoglobin: 14.1 g/dL (ref 12.0–15.0)
MCH: 30.7 pg (ref 26.0–34.0)
MCHC: 33.5 g/dL (ref 30.0–36.0)
MCV: 91.5 fL (ref 80.0–100.0)
Platelets: 336 10*3/uL (ref 150–400)
RBC: 4.6 MIL/uL (ref 3.87–5.11)
RDW: 12.3 % (ref 11.5–15.5)
WBC: 12.7 10*3/uL — ABNORMAL HIGH (ref 4.0–10.5)
nRBC: 0 % (ref 0.0–0.2)

## 2021-05-27 LAB — COMPREHENSIVE METABOLIC PANEL
ALT: 19 U/L (ref 0–44)
AST: 19 U/L (ref 15–41)
Albumin: 4.1 g/dL (ref 3.5–5.0)
Alkaline Phosphatase: 62 U/L (ref 38–126)
Anion gap: 7 (ref 5–15)
BUN: <5 mg/dL — ABNORMAL LOW (ref 6–20)
CO2: 24 mmol/L (ref 22–32)
Calcium: 9.3 mg/dL (ref 8.9–10.3)
Chloride: 104 mmol/L (ref 98–111)
Creatinine, Ser: 0.69 mg/dL (ref 0.44–1.00)
GFR, Estimated: >60 mL/min (ref >60)
Glucose, Bld: 91 mg/dL (ref 70–99)
Potassium: 3.8 mmol/L (ref 3.5–5.1)
Sodium: 135 mmol/L (ref 135–145)
Total Bilirubin: 0.9 mg/dL (ref 0.3–1.2)
Total Protein: 7.3 g/dL (ref 6.5–8.1)

## 2021-05-27 LAB — ABO/RH: ABO/RH(D): O POS

## 2021-05-27 LAB — POC URINE PREG, ED: Preg Test, Ur: POSITIVE — AB

## 2021-05-27 LAB — LIPASE, BLOOD: Lipase: 28 U/L (ref 11–51)

## 2021-05-27 LAB — HCG, QUANTITATIVE, PREGNANCY: hCG, Beta Chain, Quant, S: 1013 m[IU]/mL — ABNORMAL HIGH (ref <5)

## 2021-05-27 MED ORDER — ACETAMINOPHEN 500 MG PO TABS
1000.0000 mg | ORAL_TABLET | Freq: Once | ORAL | Status: AC
  Administered 2021-05-27: 18:00:00 1000 mg via ORAL
  Filled 2021-05-27: qty 2

## 2021-05-27 NOTE — ED Provider Notes (Signed)
[facility] Emergency Department Provider Note ____________________________________________   Event Date/Time   First MD Initiated Contact with Patient 05/27/21 1613     (approximate)  I have reviewed the triage vital signs and the nursing notes.  HISTORY  Chief Complaint Abdominal Pain   HPI Shelly Harris is a 20 y.o. femalewho presents to the ED for evaluation of abdominal pain.   Chart review indicates anxiety and depression. Obesity.  Patient presents to the ED for evaluation of sharp left lower quadrant abdominal pain that started last night.  She reports the pain has been persistent today throughout the day.  She has not used any medication.  She reports developing light vaginal spotting this afternoon alongside the pain.  LMP was 4 to 5 weeks ago.  Denies any dysuria, hematuria, diarrhea, fever, syncope or emesis.  Reports mild pain right now.  Sharp and nonradiating, to her LLQ.  Past Medical History:  Diagnosis Date   Anxiety    Chlamydia 02/2019   Depression    GERD (gastroesophageal reflux disease)    Orthodontics    Invisalign   RSV (respiratory syncytial virus infection)    as infant   Seasonal allergies    Stomach ulcer     Patient Active Problem List   Diagnosis Date Noted   Depression, major, single episode, moderate ([place]) 09/08/2020   Morbid obesity ([place]) 09/08/2020   Abnormal weight gain 07/04/2019   Anxiety and depression 03/25/2019    Past Surgical History:  Procedure Laterality Date   DENTAL REHABILITATION     age 18   TONSILLECTOMY AND ADENOIDECTOMY Bilateral 02/07/2017   Procedure: TONSILLECTOMY;  Surgeon: Clyde Canterbury, MD;  Location: [facility];  Service: ENT;  Laterality: Bilateral;    Prior to Admission medications   Medication Sig Start Date End Date Taking? Authorizing Provider  buPROPion (WELLBUTRIN SR) 150 MG 12 hr tablet TAKE 1 TABLET BY MOUTH EVERY MORNING FOR 1 WEEK. INCREASE TO 1 TABLET 2  TIMES DAILY THEREAFTER 01/22/21   McElwee, Lauren A, NP  citalopram (CELEXA) 40 MG tablet TAKE 1 TABLET(40 MG) BY MOUTH DAILY 04/01/21   McElwee, Lauren A, NP  hydrOXYzine (ATARAX) 25 MG tablet Take 1 tablet (25 mg total) by mouth every 6 (six) hours. 05/25/21   Sharion Balloon, NP  omeprazole (PRILOSEC) 20 MG capsule Take 1 capsule (20 mg total) by mouth daily. 09/08/20   McElwee, Scheryl Darter, NP    Allergies Patient has no known allergies.  Family History  Problem Relation Age of Onset   Kidney disease Mother    Cancer Father    Diabetes Maternal Grandmother    Breast cancer Neg Hx    Ovarian cancer Neg Hx     Social History Social History   Tobacco Use   Smoking status: Never   Smokeless tobacco: Never  Vaping Use   Vaping Use: Every day  Substance Use Topics   Alcohol use: Never   Drug use: Never    Review of Systems  Constitutional: No fever/chills Eyes: No visual changes. ENT: No sore throat. Cardiovascular: Denies chest pain. Respiratory: Denies shortness of breath. Gastrointestinal: Positive for abdominal pain and vaginal spotting. No nausea, no vomiting.  No diarrhea.  No constipation. Genitourinary: Negative for dysuria. Musculoskeletal: Negative for back pain. Skin: Negative for rash. Neurological: Negative for headaches, focal weakness or numbness.  ____________________________________________   PHYSICAL EXAM:  VITAL SIGNS: Vitals:   05/27/21 1554 05/27/21 1758  BP: 136/83 125/72  Pulse: Marland Kitchen)  101 85  Resp: 18 16  Temp: 99.1 F (37.3 C) 98.5 F (36.9 C)  SpO2: 98% 96%      Constitutional: Alert and oriented. Well appearing and in no acute distress.  Obese.  Pleasant and conversational. Eyes: Conjunctivae are normal. PERRL. EOMI. Head: Atraumatic. Nose: No congestion/rhinnorhea. Mouth/Throat: Mucous membranes are moist.  Oropharynx non-erythematous. Neck: No stridor. No cervical spine tenderness to palpation. Cardiovascular: Normal rate, regular  rhythm. Good peripheral circulation. Respiratory: Normal respiratory effort.  No retractions. Lungs CTAB. Gastrointestinal: Soft , nondistended, Mild LLQ tenderness without guarding or peritoneal features. Musculoskeletal: No joint effusions. No signs of acute trauma. Neurologic:  Normal speech and language. No gross focal neurologic deficits are appreciated. No gait instability noted. Skin:  Skin is warm, dry and intact. No rash noted. Psychiatric: Mood and affect are normal. Speech and behavior are normal.  ____________________________________________   LABS (all labs ordered are listed, but only abnormal results are displayed)  Labs Reviewed  COMPREHENSIVE METABOLIC PANEL - Abnormal; Notable for the following components:      Result Value   BUN <5 (*)    All other components within normal limits  CBC - Abnormal; Notable for the following components:   WBC 12.7 (*)    All other components within normal limits  URINALYSIS, ROUTINE W REFLEX MICROSCOPIC - Abnormal; Notable for the following components:   Color, Urine YELLOW (*)    APPearance HAZY (*)    Hgb urine dipstick SMALL (*)    Ketones, ur 5 (*)    Leukocytes,Ua TRACE (*)    All other components within normal limits  HCG, QUANTITATIVE, PREGNANCY - Abnormal; Notable for the following components:   hCG, Beta Chain, Quant, S 1,013 (*)    All other components within normal limits  POC URINE PREG, ED - Abnormal; Notable for the following components:   Preg Test, Ur POSITIVE (*)    All other components within normal limits  LIPASE, BLOOD  ABO/RH   ____________________________________________  12 Lead EKG   ____________________________________________  RADIOLOGY  ED MD interpretation:    Official radiology report(s): US OB LESS THAN 14 WEEKS WITH OB TRANSVAGINAL  Result Date: 05/27/2021 CLINICAL DATA:  Left lower quadrant pain and spotting for 1 day. Estimated gestational age by LMP is 3 weeks 2 days. Quantitative  beta HCG is 1,013 EXAM: OBSTETRIC <14 WK [place] AND TRANSVAGINAL OB US TECHNIQUE: Both transabdominal and transvaginal ultrasound examinations were performed for complete evaluation of the gestation as well as the maternal uterus, adnexal regions, and pelvic cul-de-sac. Transvaginal technique was performed to assess early pregnancy. COMPARISON:  None. FINDINGS: Intrauterine gestational sac: None Yolk sac:  Not Visualized. Embryo:  Not Visualized. Cardiac Activity: Not Visualized. Maternal uterus/adnexae: Uterus is anteverted. No myometrial mass lesions identified. Endometrial stripe thickness is normal. No abnormal endometrial fluid. Small nabothian cysts in the cervix. Both ovaries are visualized and are unremarkable in appearance. Moderate free fluid is demonstrated in the pelvis with debris, possibly hemorrhagic. IMPRESSION: No intrauterine gestational sac, yolk sac, or fetal pole identified. Differential considerations include intrauterine pregnancy too early to be sonographically visualized, missed abortion, or ectopic pregnancy. Followup ultrasound is recommended in 10-14 days for further evaluation. Moderate free fluid in the pelvis. Electronically Signed   By: Lucienne Capers M.D.   On: 05/27/2021 17:54    ____________________________________________   PROCEDURES and INTERVENTIONS  Procedure(s) performed (including Critical Care):  Procedures  Medications  acetaminophen (TYLENOL) tablet 1,000 mg (1,000 mg Oral Given 05/27/21 1805)  ____________________________________________   MDM / ED COURSE   20 year old female presents to the ED with lower abdominal discomfort and spotting, found to be pregnant and suitable for outpatient management and OB/GYN follow-up.  Minimal leukocytosis is noted, otherwise blood work is benign.  No evidence of sepsis or systemic illness.  No evidence of UTI or asymptomatic bacteriuria.  Surprisingly to her, she is pregnant.  hCG quant is about 1000 and  appropriate considering her LMP of about 4 weeks ago.  TVUS without IUP or signs of ectopic pregnancy.  Pelvic free fluid is noted.  She looks well to me and has a benign abdominal examination.  Some mild tenderness on deep palpation, but no guarding or peritoneal features.  At this point I think outpatient management is reasonable with close OB/GYN follow-up.  We discussed return precautions for the ED.  No indications for RhoGAM  Clinical Course as of 05/27/21 1807  Thu May 27, 2021  1804 Reassessed and reexamined.  Less tender now and says she feels fine.  Just now getting Tylenol.  We discussed a degree of uncertainty with her diagnosis.  We discussed missed miscarriage, early gestation that we are unable to visualize with possible implantation bleeding, we discussed the possibility of ectopic pregnancy.  We discussed no current indication to admit her to the hospital or perform other tests as she looks so well.  We discussed following up quite closely with OB/GYN and return precautions for the ED.  We discussed what to expect and neck steps of evaluation [DS]    Clinical Course User Index [DS] Delton Prairie, MD    ____________________________________________   FINAL CLINICAL IMPRESSION(S) / ED DIAGNOSES  Final diagnoses:  Abdominal pain  Less than [redacted] weeks gestation of pregnancy     ED Discharge Orders     None        Margan Elias Katrinka Blazing   Note:  This document was prepared using Dragon voice recognition software and may include unintentional dictation errors.    Delton Prairie, MD 05/27/21 [phone]

## 2021-05-27 NOTE — ED Triage Notes (Signed)
Pt has lower abd pain.  Pt states I think I had an ovarian cyst burst.  Intermittent sharp pains.  No vomiting or diarrhea.  Pt alert  speech clear.

## 2021-05-27 NOTE — ED Notes (Signed)
Pt to ED c/o abdominal pain, pt states she has LLQ quadrant pain. Denies Vomiting and diarrhea. Has had episodes of being nauseous.  Denies SOB.  States she also woke up this am, with light pink spotting.Last period 2 weeks ago.   Pt is A&Ox4, NAD

## 2021-05-27 NOTE — ED Notes (Signed)
Patient transported to Ultrasound 

## 2021-05-27 NOTE — Discharge Instructions (Signed)
Use Tylenol for pain and fevers.  Up to 1000 mg per dose, up to 4 times per day.  Do not take more than 4000 mg of Tylenol/acetaminophen within 24 hours..  Please reach out to your OB/GYN to be seen in the next week or so.  They will want to repeat your beta hCG value, this is the blood test looking at pregnancy hormone number.  Yours was 1013 today, and it should continue to increase if this is a "normal" pregnancy.  If you develop any severely worsening abdominal pain, fevers or uncontrolled bleeding, please return to the ED.

## 2021-05-28 ENCOUNTER — Other Ambulatory Visit: Payer: Self-pay | Admitting: Obstetrics & Gynecology

## 2021-05-28 DIAGNOSIS — N926 Irregular menstruation, unspecified: Secondary | ICD-10-CM

## 2021-06-01 ENCOUNTER — Other Ambulatory Visit: Payer: Self-pay

## 2021-06-01 ENCOUNTER — Other Ambulatory Visit: Payer: BC Managed Care – PPO

## 2021-06-01 DIAGNOSIS — N926 Irregular menstruation, unspecified: Secondary | ICD-10-CM

## 2021-06-02 ENCOUNTER — Other Ambulatory Visit: Payer: Self-pay | Admitting: Nurse Practitioner

## 2021-06-02 ENCOUNTER — Encounter: Payer: Self-pay | Admitting: Nurse Practitioner

## 2021-06-02 LAB — BETA HCG QUANT (REF LAB): hCG Quant: 225 m[IU]/mL

## 2021-06-02 NOTE — Telephone Encounter (Signed)
Requested medications are due for refill today.  yes  Requested medications are on the active medications list.  yes  Last refill. 04/01/2021  Future visit scheduled.   no  Notes to clinic.  Pt is expecting. Unsure if this medication is ok during pregnancy. Also pt is due for an OV.    Requested Prescriptions  Pending Prescriptions Disp Refills   citalopram (CELEXA) 40 MG tablet [Pharmacy Med Name: CITALOPRAM 40MG  TABLETS] 30 tablet 1    Sig: TAKE 1 TABLET(40 MG) BY MOUTH DAILY     Psychiatry:  Antidepressants - SSRI Failed - 06/02/2021  3:13 AM      Failed - Valid encounter within last 6 months    Recent Outpatient Visits           6 months ago Morbid obesity ([place])   [place], Lauren A, NP   8 months ago Depression, major, single episode, moderate ([place])   [place], Lauren A, NP              Passed - Completed PHQ-2 or PHQ-9 in the last 360 days

## 2021-06-02 NOTE — Progress Notes (Signed)
Pt aware, no problems or concerns no bleeding as of now

## 2021-06-02 NOTE — Progress Notes (Signed)
Left message for pt to call back  °

## 2021-06-02 NOTE — Progress Notes (Signed)
Call patient and let her know lab results (does not have MyChart).  Her levels are coming down, suggestive of miscarriage.  Sch appt as desired.

## 2021-06-03 NOTE — Telephone Encounter (Signed)
Left message asking pt to call back to schedule an appt. °

## 2021-06-04 NOTE — Telephone Encounter (Signed)
2nd attempt-Lmom asking pt to call back to schedule an appt. °

## 2021-06-07 ENCOUNTER — Ambulatory Visit: Payer: BC Managed Care – PPO | Admitting: Nurse Practitioner

## 2021-06-08 MED ORDER — CITALOPRAM HYDROBROMIDE 40 MG PO TABS: ORAL_TABLET | ORAL | 0 refills | Status: DC

## 2021-06-08 NOTE — Telephone Encounter (Signed)
Please call to schedule appointment for anxiety within the next 3 months.

## 2021-06-08 NOTE — Telephone Encounter (Signed)
Unable to leave vm to schedule an appt due to full mailbox.

## 2021-06-09 NOTE — Telephone Encounter (Signed)
2nd attempt- Unable to leave vm to schedule an appt with pt

## 2021-06-10 NOTE — Telephone Encounter (Signed)
3rd attempt Unable to leave vm for follow up visit due to full mailbox

## 2021-06-12 ENCOUNTER — Emergency Department
Admission: EM | Admit: 2021-06-12 | Discharge: 2021-06-12 | Disposition: A | Discharge status: Home / Self Care | Payer: Self-pay | Attending: Emergency Medicine | Admitting: Emergency Medicine

## 2021-06-12 ENCOUNTER — Emergency Department: Payer: Self-pay

## 2021-06-12 ENCOUNTER — Other Ambulatory Visit: Payer: Self-pay

## 2021-06-12 DIAGNOSIS — R102 Pelvic and perineal pain: Secondary | ICD-10-CM

## 2021-06-12 DIAGNOSIS — Z5321 Procedure and treatment not carried out due to patient leaving prior to being seen by health care provider: Secondary | ICD-10-CM | POA: Insufficient documentation

## 2021-06-12 DIAGNOSIS — O039 Complete or unspecified spontaneous abortion without complication: Secondary | ICD-10-CM | POA: Insufficient documentation

## 2021-06-12 DIAGNOSIS — Z3A01 Less than 8 weeks gestation of pregnancy: Secondary | ICD-10-CM | POA: Insufficient documentation

## 2021-06-12 DIAGNOSIS — O26891 Other specified pregnancy related conditions, first trimester: Secondary | ICD-10-CM | POA: Insufficient documentation

## 2021-06-12 LAB — POC URINE PREG, ED: Preg Test, Ur: POSITIVE — AB

## 2021-06-12 LAB — BASIC METABOLIC PANEL
Anion gap: 6 (ref 5–15)
BUN: 7 mg/dL (ref 6–20)
CO2: 27 mmol/L (ref 22–32)
Calcium: 9.6 mg/dL (ref 8.9–10.3)
Chloride: 105 mmol/L (ref 98–111)
Creatinine, Ser: 0.72 mg/dL (ref 0.44–1.00)
GFR, Estimated: >60 mL/min (ref >60)
Glucose, Bld: 109 mg/dL — ABNORMAL HIGH (ref 70–99)
Potassium: 3.9 mmol/L (ref 3.5–5.1)
Sodium: 138 mmol/L (ref 135–145)

## 2021-06-12 LAB — CBC WITH DIFFERENTIAL/PLATELET
Abs Immature Granulocytes: 0.04 10*3/uL (ref 0.00–0.07)
Basophils Absolute: 0.1 10*3/uL (ref 0.0–0.1)
Basophils Relative: 1 %
Eosinophils Absolute: 0.2 10*3/uL (ref 0.0–0.5)
Eosinophils Relative: 1 %
HCT: 42 % (ref 36.0–46.0)
Hemoglobin: 14.1 g/dL (ref 12.0–15.0)
Immature Granulocytes: 0 %
Lymphocytes Relative: 26 %
Lymphs Abs: 2.8 10*3/uL (ref 0.7–4.0)
MCH: 30.7 pg (ref 26.0–34.0)
MCHC: 33.6 g/dL (ref 30.0–36.0)
MCV: 91.5 fL (ref 80.0–100.0)
Monocytes Absolute: 0.6 10*3/uL (ref 0.1–1.0)
Monocytes Relative: 5 %
Neutro Abs: 7.3 10*3/uL (ref 1.7–7.7)
Neutrophils Relative %: 67 %
Platelets: 333 10*3/uL (ref 150–400)
RBC: 4.59 MIL/uL (ref 3.87–5.11)
RDW: 12 % (ref 11.5–15.5)
WBC: 11 10*3/uL — ABNORMAL HIGH (ref 4.0–10.5)
nRBC: 0 % (ref 0.0–0.2)

## 2021-06-12 LAB — URINALYSIS, ROUTINE W REFLEX MICROSCOPIC
Bilirubin Urine: NEGATIVE
Glucose, UA: NEGATIVE mg/dL
Hgb urine dipstick: NEGATIVE
Ketones, ur: NEGATIVE mg/dL
Leukocytes,Ua: NEGATIVE
Nitrite: NEGATIVE
Protein, ur: NEGATIVE mg/dL
Specific Gravity, Urine: 1.025 (ref 1.005–1.030)
pH: 6.5 (ref 5.0–8.0)

## 2021-06-12 LAB — ABO/RH: ABO/RH(D): O POS

## 2021-06-12 LAB — HCG, QUANTITATIVE, PREGNANCY: hCG, Beta Chain, Quant, S: 96 m[IU]/mL — ABNORMAL HIGH (ref <5)

## 2021-06-12 NOTE — ED Triage Notes (Addendum)
Pt presents to ER c/o left lower abd pain x2 days.  Pt states she found out she was around 3.[redacted] weeks pregnant and started having light spotting and was told she was having a miscarriage.  Pt states her OB/GYN told her to come be evaluated to r/o ectopic pregnancy.  Denies vaginal bleeding at this time.

## 2021-07-09 ENCOUNTER — Other Ambulatory Visit: Payer: Self-pay | Admitting: Nurse Practitioner

## 2021-08-04 ENCOUNTER — Other Ambulatory Visit (HOSPITAL_COMMUNITY)
Admission: RE | Admit: 2021-08-04 | Discharge: 2021-08-04 | Disposition: A | Discharge status: Home / Self Care | Payer: Self-pay | Source: Ambulatory Visit | Attending: Obstetrics and Gynecology | Admitting: Obstetrics and Gynecology

## 2021-08-04 ENCOUNTER — Encounter: Payer: Self-pay | Admitting: Obstetrics and Gynecology

## 2021-08-04 ENCOUNTER — Other Ambulatory Visit: Payer: Self-pay

## 2021-08-04 ENCOUNTER — Ambulatory Visit (INDEPENDENT_AMBULATORY_CARE_PROVIDER_SITE_OTHER): Payer: BLUE CROSS/BLUE SHIELD | Admitting: Obstetrics and Gynecology

## 2021-08-04 VITALS — BP 120/82 | Ht 65.0 in | Wt 280.0 lb

## 2021-08-04 DIAGNOSIS — Z01419 Encounter for gynecological examination (general) (routine) without abnormal findings: Secondary | ICD-10-CM | POA: Diagnosis not present

## 2021-08-04 DIAGNOSIS — Z6841 Body Mass Index (BMI) 40.0 and over, adult: Secondary | ICD-10-CM | POA: Diagnosis not present
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
## 2021-08-06 LAB — CERVICOVAGINAL ANCILLARY ONLY
Bacterial Vaginitis (gardnerella): POSITIVE — AB
Candida Glabrata: NEGATIVE
Candida Vaginitis: NEGATIVE
Chlamydia: POSITIVE — AB
Comment: NEGATIVE
Comment: NEGATIVE
Comment: NEGATIVE
Comment: NEGATIVE
Comment: NEGATIVE
Comment: NORMAL
Neisseria Gonorrhea: NEGATIVE
Trichomonas: NEGATIVE

## 2021-08-06 LAB — CYTOLOGY - PAP: Diagnosis: NEGATIVE

## 2021-08-09 ENCOUNTER — Telehealth: Payer: Self-pay

## 2021-08-09 MED ORDER — METRONIDAZOLE 500 MG PO TABS: 500.0000 mg | ORAL_TABLET | Freq: Two times a day (BID) | ORAL | 0 refills | Status: DC

## 2021-08-09 MED ORDER — DOXYCYCLINE HYCLATE 100 MG PO CAPS: 100.0000 mg | ORAL_CAPSULE | Freq: Two times a day (BID) | ORAL | 0 refills | Status: DC
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
## 2021-08-09 NOTE — Telephone Encounter (Signed)
Patient has viewed message in my chart. Communicable disease Report completed and faxed via Epic. ?

## 2021-08-09 NOTE — Telephone Encounter (Signed)
Voice mail full unable to leave message. Will send my chart message. ?

## 2021-08-09 NOTE — Addendum Note (Signed)
Addended by: Catalina Antigua on: 08/09/2021 08:53 AM ? ? Modules accepted: Orders ? ?

## 2021-09-10 ENCOUNTER — Other Ambulatory Visit: Payer: Self-pay | Admitting: Nurse Practitioner

## 2021-09-13 ENCOUNTER — Ambulatory Visit: Payer: Self-pay | Admitting: Internal Medicine

## 2021-11-29 ENCOUNTER — Ambulatory Visit: Payer: BLUE CROSS/BLUE SHIELD

## 2021-12-01 ENCOUNTER — Other Ambulatory Visit: Payer: Self-pay | Admitting: Nurse Practitioner

## 2021-12-02 ENCOUNTER — Ambulatory Visit: Payer: Self-pay | Admitting: Family Medicine

## 2022-03-01 IMAGING — US US OB < 14 WEEKS - US OB TV
1 series · 13 of 28 positions shown · non-contrast
Comparison: None.

CLINICAL DATA: Left lower quadrant pain and spotting for 1 day.
Estimated gestational age by LMP is 3 weeks 2 days. Quantitative
beta HCG is 1,013

EXAM:
OBSTETRIC <14 WK US AND TRANSVAGINAL OB US
TECHNIQUE: Both transabdominal and transvaginal ultrasound examinations were
performed for complete evaluation of the gestation as well as the
maternal uterus, adnexal regions, and pelvic cul-de-sac.
Transvaginal technique was performed to assess early pregnancy.

[Series 1: us ob less than 14 weeks with ob transvaginal · 106 acquisitions, 13 frames shown]
[im 4/106]
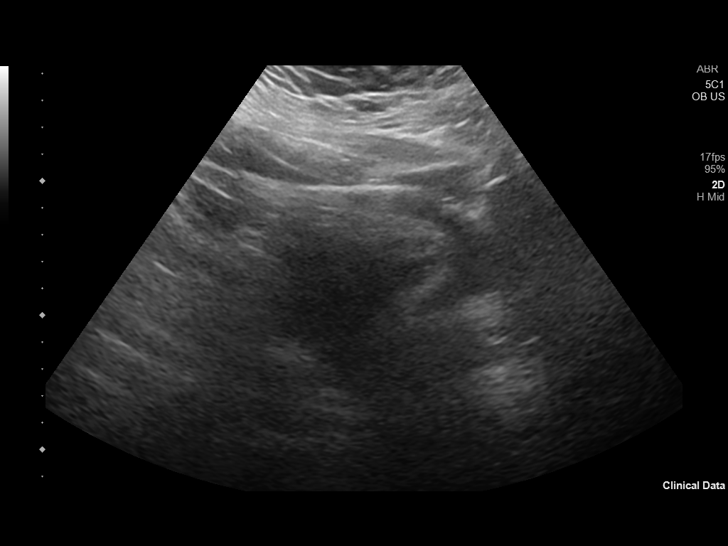
[im 12/106]
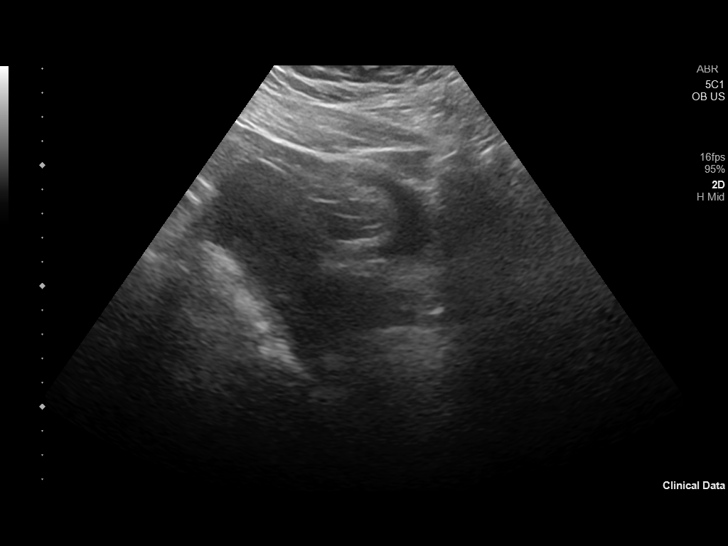
[im 20/106]
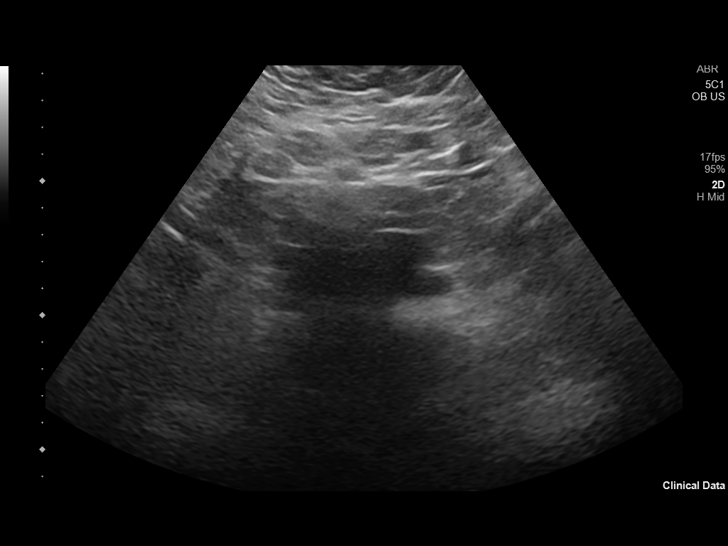
[im 28/106]
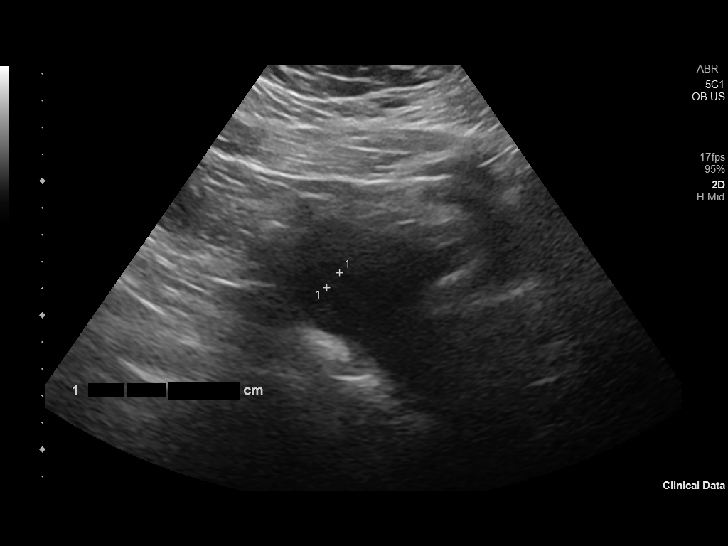
[im 36/106]
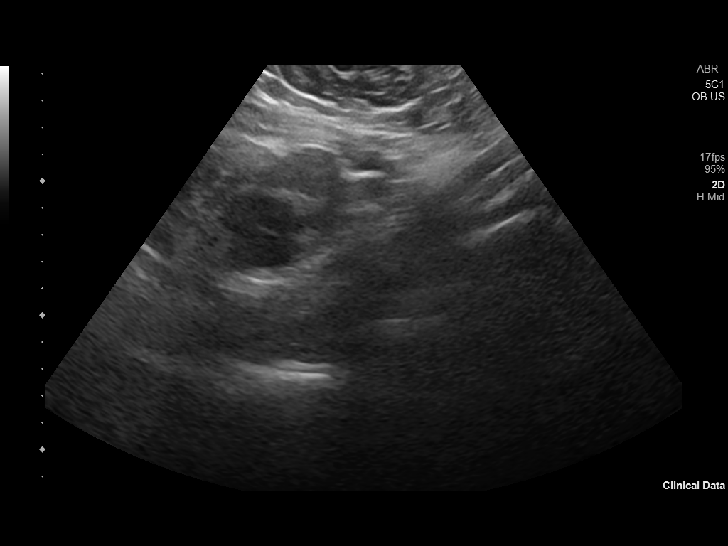
[im 43/106]
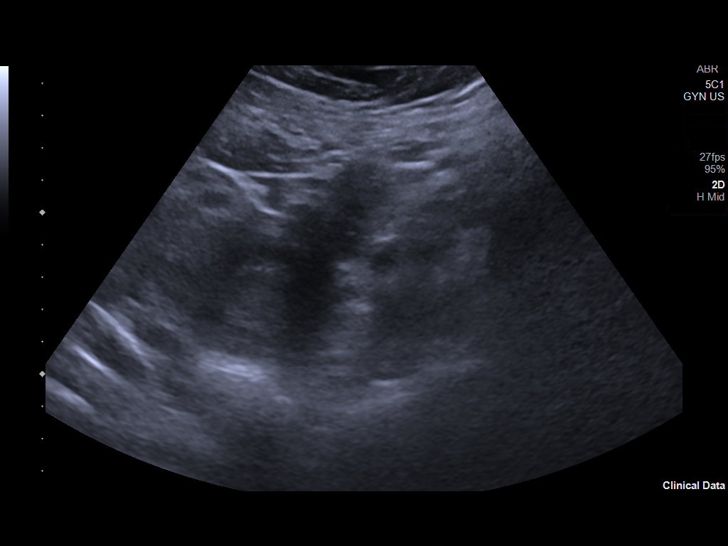
[im 55/106]
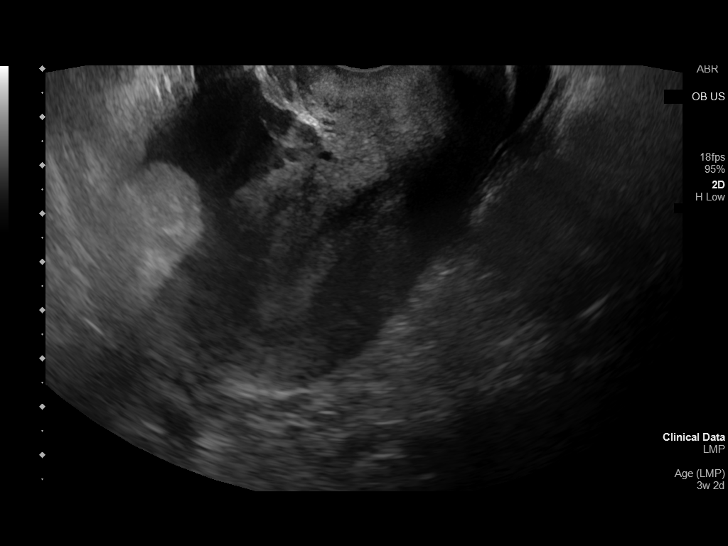
[im 63/106]
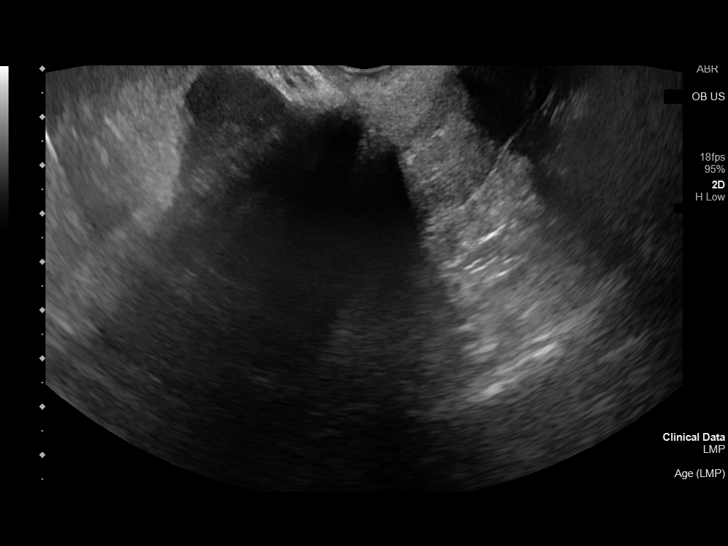
[im 71/106]
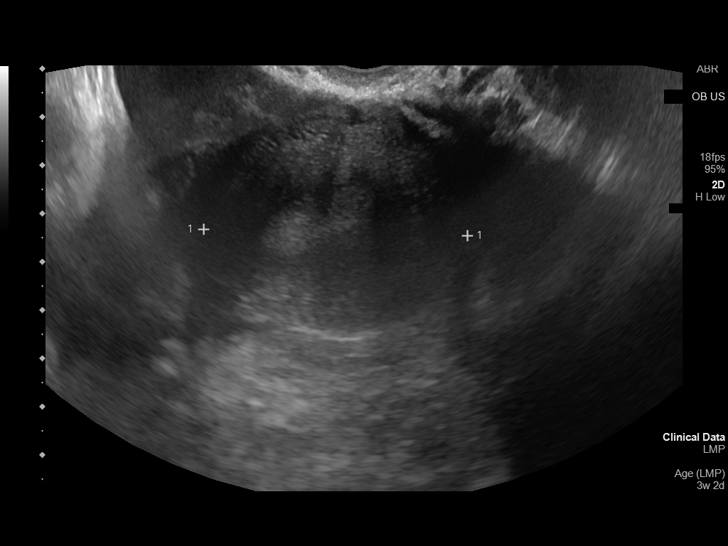
[im 78/106]
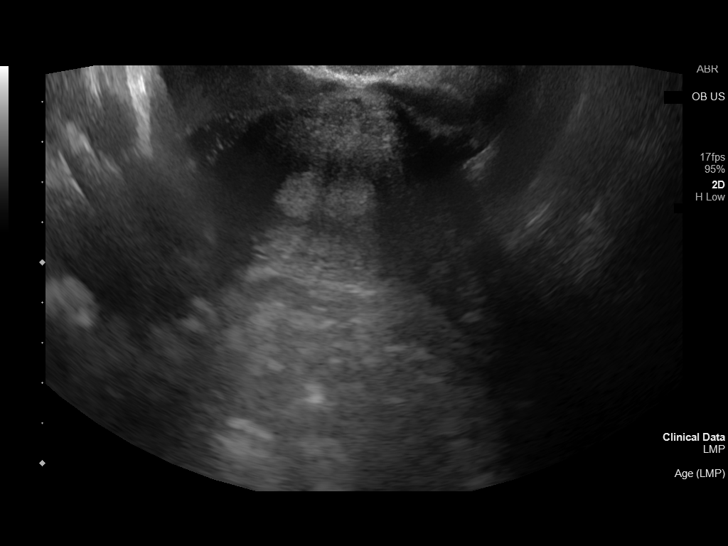
[im 86/106]
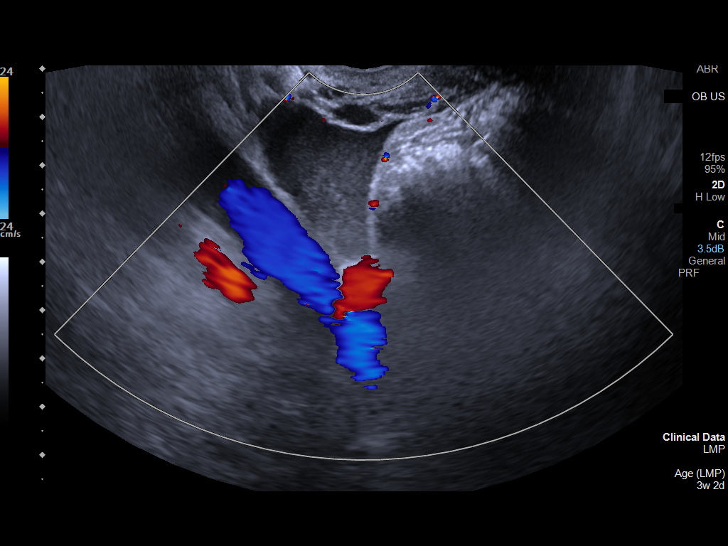
[im 94/106]
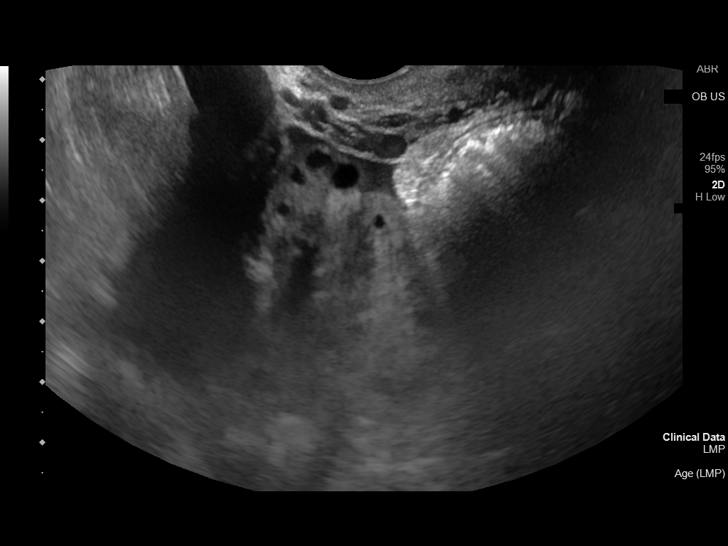
[im 102/106]
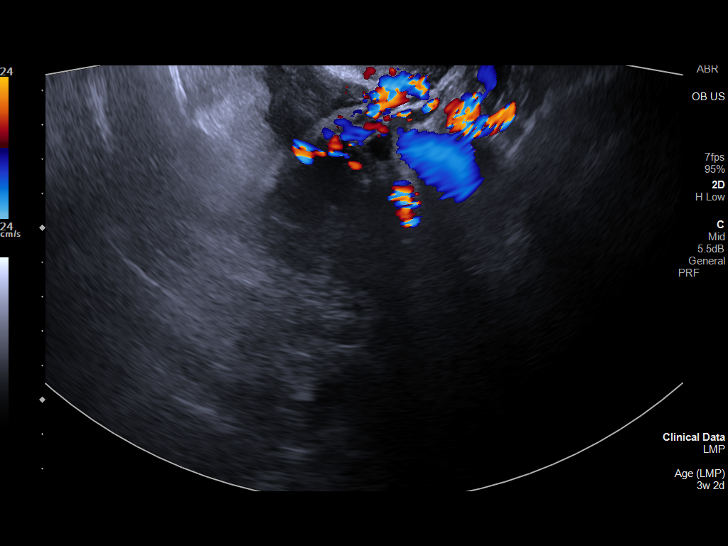

[13 of 28 positions shown; findings below may reference images not displayed]

FINDINGS: Intrauterine gestational sac: None

Yolk sac:  Not Visualized.

Embryo:  Not Visualized.

Cardiac Activity: Not Visualized.

Maternal uterus/adnexae: Uterus is anteverted. No myometrial mass
lesions identified. Endometrial stripe thickness is normal. No
abnormal endometrial fluid. Small nabothian cysts in the cervix.
Both ovaries are visualized and are unremarkable in appearance.
Moderate free fluid is demonstrated in the pelvis with debris,
possibly hemorrhagic.
IMPRESSION: No intrauterine gestational sac, yolk sac, or fetal pole identified.
Differential considerations include intrauterine pregnancy too early
to be sonographically visualized, missed abortion, or ectopic
pregnancy. Followup ultrasound is recommended in 10-14 days for
further evaluation. Moderate free fluid in the pelvis.

## 2022-03-07 ENCOUNTER — Encounter: Payer: Self-pay | Admitting: Physician Assistant

## 2022-03-07 ENCOUNTER — Ambulatory Visit (INDEPENDENT_AMBULATORY_CARE_PROVIDER_SITE_OTHER): Payer: BLUE CROSS/BLUE SHIELD | Admitting: Physician Assistant

## 2022-03-07 VITALS — BP 127/84 | HR 83 | Temp 98.9°F | Ht 65.0 in | Wt 288.2 lb

## 2022-03-07 DIAGNOSIS — F419 Anxiety disorder, unspecified: Secondary | ICD-10-CM | POA: Diagnosis not present

## 2022-03-07 DIAGNOSIS — N912 Amenorrhea, unspecified: Secondary | ICD-10-CM | POA: Diagnosis not present

## 2022-03-07 DIAGNOSIS — F32A Depression, unspecified: Secondary | ICD-10-CM | POA: Diagnosis not present

## 2022-03-07 MED ORDER — CITALOPRAM HYDROBROMIDE 20 MG PO TABS: 20.0000 mg | ORAL_TABLET | Freq: Every day | ORAL | 3 refills | Status: DC

## 2022-03-07 MED ORDER — BUPROPION HCL ER (XL) 150 MG PO TB24: 150.0000 mg | ORAL_TABLET | Freq: Every day | ORAL | 1 refills | Status: DC

## 2022-03-07 NOTE — Progress Notes (Unsigned)
Established Patient Office Visit  Name: Shelly Harris   MRN: [id number]    DOB: [birth year]   Date:03/07/2022  Today's Provider: Talitha Givens, MHS, PA-C Introduced myself to the patient as a PA-C and provided education on APPs in clinical practice.         Subjective  Chief Complaint  Chief Complaint  Patient presents with   Obesity   Anxiety   Depression   Upper body pain    Since Thursday, pain has been in different areas of upper body since Thursday    Diabetes    Wants to make sure that she does not have since she has a family history     HPI  Acute upper body pain Started Thursday  States when she sat up she started having pain in her right arm then felt dizzy when she stood. States it progressed to include chest tightness and some back pain States yesterday she started having pain in her left arm Reports location changes along upper body but once it starts it is constant  Pain level: 1-3/10 Thursday was up to 5-6/10  Alleviating: nothing  Aggravating: laying down and hugging chest seems to help  Interventions: Tylenol  She vapes every day She denies recent injuries or trauma She has a hx of Heartburn and was using medication for this but no longer takes  Reports pain improved and resolved when she was distracted from it.   She has stopped taking her anxiety and Depression medications because she thought it was causing weight gain   DEPRESSION Mood status: uncontrolled Satisfied with current treatment?: no Symptom severity: moderate  Duration of current treatment :  not on treatment  Side effects: no Medication compliance: poor compliance Psychotherapy/counseling: yes in the past- was using [facility] to see therapist but would prefer to be in person. Previous psychiatric medications: celexa, wellbutrin, and zoloft- did not like these  Depressed mood: yes Anxious mood: yes Anhedonia: yes Significant weight loss or gain: yes Insomnia: no hard to  fall asleep- when her anxiety is flared Fatigue: no Feelings of worthlessness or guilt: yes Impaired concentration/indecisiveness: yes Suicidal ideations: no Hopelessness: yes Crying spells: yes    03/07/2022    1:18 PM 11/26/2020   11:22 AM 09/08/2020   10:26 AM 05/14/2019   10:21 AM 02/18/2019   11:46 AM  Depression screen PHQ 2/9  Decreased Interest 2 0 3 0 0  Down, Depressed, Hopeless 2 1 3 1 1   PHQ - 2 Score 4 1 6 1 1   Altered sleeping 1 0 3 1 0  Tired, decreased energy 2 0 3 0 0  Change in appetite 3 3 3 1 2   Feeling bad or failure about yourself  3 1 2 1  0  Trouble concentrating 2 0 0 0 0  Moving slowly or fidgety/restless 0 0 1 0 0  Suicidal thoughts 0 0 0 0 0  PHQ-9 Score 15 5 18 4 3   Difficult doing work/chores Extremely dIfficult Not difficult at all Very difficult Somewhat difficult Not difficult at all    ANXIETY/STRESS Duration:uncontrolled Anxious mood: yes  Excessive worrying: yes Irritability: yes  Sweating: no Nausea: no Palpitations:no Hyperventilation: yes Panic attacks: yes- most recent one was Thursday  Agoraphobia: no  Obscessions/compulsions: yes- reports she is a stress eater, will eat when she is stressed or down. States " I have a bad relationship with food"  Depressed mood: yes Anhedonia: yes Weight changes: yes- reports  Insomnia: no hard to fall asleep when anxiety is flared.  Hypersomnia: yes Fatigue/loss of energy: yes Feelings of worthlessness: yes Feelings of guilt: yes Impaired concentration/indecisiveness: yes Suicidal ideations: no  Crying spells: yes Recent Stressors/Life Changes: no   Relationship problems: yes   Family stress: yes     Financial stress: yes    Job stress: yes    Recent death/loss: no     2022-03-29    1:19 PM 11/26/2020   11:23 AM 09/08/2020   10:27 AM 05/14/2019   10:22 AM  GAD 7 : Generalized Anxiety Score  Nervous, Anxious, on Edge 3 0 3 1  Control/stop worrying 3 1 3 1   Worry too much - different  things 3 1 3 1   Trouble relaxing 3 1 1 1   Restless 2 0 3 0  Easily annoyed or irritable 2 1 3 1   Afraid - awful might happen 3 1 3 1   Total GAD 7 Score 19 5 19 6   Anxiety Difficulty Extremely difficult Not difficult at all Extremely difficult Somewhat difficult     Obesity Reports she thinks her appearance and weight gain are tied to her depression Reports she is exercising daily but mentions she has a bad relationship with food   She has not had her period for 4 months  She is not on birth control She has taken pregnancy tests at home but these have all been negative     Patient Active Problem List   Diagnosis Date Noted   Depression, major, single episode, moderate ([place]) 09/08/2020   Morbid obesity ([place]) 09/08/2020   Abnormal weight gain 07/04/2019   Anxiety and depression 03/25/2019    Past Surgical History:  Procedure Laterality Date   DENTAL REHABILITATION     age 55   TONSILLECTOMY AND ADENOIDECTOMY Bilateral 02/07/2017   Procedure: TONSILLECTOMY;  Surgeon: Clyde Canterbury, MD;  Location: [facility];  Service: ENT;  Laterality: Bilateral;    Family History  Problem Relation Age of Onset   Kidney disease Mother    Cancer Father    Diabetes Maternal Grandmother    Breast cancer Neg Hx    Ovarian cancer Neg Hx     Social History   Tobacco Use   Smoking status: Never   Smokeless tobacco: Never  Substance Use Topics   Alcohol use: Never    No current outpatient medications on file.  No Known Allergies  I personally reviewed active problem list, medication list, allergies, health maintenance, notes from last encounter, lab results with the patient/caregiver today.   Review of Systems  Respiratory:  Positive for shortness of breath and wheezing (on Thursday). Negative for cough.   Cardiovascular:  Positive for chest pain. Negative for palpitations and leg swelling.  Gastrointestinal:  Positive for heartburn. Negative for diarrhea, nausea and  vomiting.  Musculoskeletal:  Positive for myalgias.  Neurological:  Negative for dizziness and headaches.  Psychiatric/Behavioral:  Positive for depression. The patient is nervous/anxious.       Objective  Vitals:   Mar 29, 2022 1312  BP: 127/84  Pulse: 83  Temp: 98.9 F (37.2 C)  TempSrc: Oral  SpO2: 99%  Weight: 288 lb 3.2 oz (130.7 kg)  Height: 5' 5"  (1.651 m)    Body mass index is 47.96 kg/m.  Physical Exam Vitals reviewed.  Constitutional:      General: She is awake.     Appearance: Normal appearance. She is well-developed and well-groomed.  HENT:     Head: Normocephalic and atraumatic.  Eyes:     General: Lids are normal. Gaze aligned appropriately.     Extraocular Movements: Extraocular movements intact.     Conjunctiva/sclera: Conjunctivae normal.  Cardiovascular:     Rate and Rhythm: Normal rate and regular rhythm.     Pulses: Normal pulses.          Radial pulses are 2+ on the right side and 2+ on the left side.     Heart sounds: Normal heart sounds. No murmur heard.    No friction rub. No gallop.  Pulmonary:     Effort: Pulmonary effort is normal.     Breath sounds: Normal breath sounds. No decreased air movement. No decreased breath sounds, wheezing, rhonchi or rales.  Neurological:     General: No focal deficit present.     Mental Status: She is alert and oriented to person, place, and time.     GCS: GCS eye subscore is 4. GCS verbal subscore is 5. GCS motor subscore is 6.     Cranial Nerves: No dysarthria or facial asymmetry.     Motor: Motor function is intact. No weakness or tremor.     Gait: Gait is intact.  Psychiatric:        Attention and Perception: Attention and perception normal.        Mood and Affect: Affect normal. Mood is anxious.        Speech: Speech normal.        Behavior: Behavior normal. Behavior is cooperative.        Thought Content: Thought content normal.        Cognition and Memory: Cognition and memory normal.         Judgment: Judgment normal.      No results found for this or any previous visit (from the past 2160 hour(s)).   PHQ2/9:    03/07/2022    1:18 PM 11/26/2020   11:22 AM 09/08/2020   10:26 AM 05/14/2019   10:21 AM 02/18/2019   11:46 AM  Depression screen PHQ 2/9  Decreased Interest 2 0 3 0 0  Down, Depressed, Hopeless 2 1 3 1 1   PHQ - 2 Score 4 1 6 1 1   Altered sleeping 1 0 3 1 0  Tired, decreased energy 2 0 3 0 0  Change in appetite 3 3 3 1 2   Feeling bad or failure about yourself  3 1 2 1  0  Trouble concentrating 2 0 0 0 0  Moving slowly or fidgety/restless 0 0 1 0 0  Suicidal thoughts 0 0 0 0 0  PHQ-9 Score 15 5 18 4 3   Difficult doing work/chores Extremely dIfficult Not difficult at all Very difficult Somewhat difficult Not difficult at all      Fall Risk:    03/07/2022    1:17 PM 09/08/2020    9:14 AM  Fall Risk   Falls in the past year? 0 0  Number falls in past yr: 0 0  Injury with Fall? 0 0  Risk for fall due to : No Fall Risks No Fall Risks  Follow up Falls evaluation completed Falls evaluation completed      Functional Status Survey:      Assessment & Plan  Problem List Items Addressed This Visit       Other   Anxiety and depression - Primary   Relevant Medications   buPROPion (WELLBUTRIN XL) 150 MG 24 hr tablet   citalopram (CELEXA) 20 MG tablet   Morbid obesity (  [place])   Relevant Orders   Comp Met (CMET)   CBC w/Diff   Lipid Profile   HgB A1c   TSH   Other Visit Diagnoses     Amenorrhea, unspecified       Relevant Orders   FSH/LH        Return in about 6 weeks (around 04/18/2022) for Depression, anxiety, weight.   I, Tishie Altmann E Ashten Sarnowski, PA-C, have reviewed all documentation for this visit. The documentation on 03/07/22 for the exam, diagnosis, procedures, and orders are all accurate and complete.   Talitha Givens, MHS, PA-C [facility]

## 2022-03-08 DIAGNOSIS — N912 Amenorrhea, unspecified: Secondary | ICD-10-CM | POA: Insufficient documentation

## 2022-03-08 LAB — COMPREHENSIVE METABOLIC PANEL
ALT: 33 IU/L — ABNORMAL HIGH (ref 0–32)
AST: 27 IU/L (ref 0–40)
Albumin/Globulin Ratio: 1.8 (ref 1.2–2.2)
Albumin: 5.1 g/dL — ABNORMAL HIGH (ref 4.0–5.0)
Alkaline Phosphatase: 89 IU/L (ref 44–121)
BUN/Creatinine Ratio: 9 (ref 9–23)
BUN: 7 mg/dL (ref 6–20)
Bilirubin Total: 0.5 mg/dL (ref 0.0–1.2)
CO2: 20 mmol/L (ref 20–29)
Calcium: 10.3 mg/dL — ABNORMAL HIGH (ref 8.7–10.2)
Chloride: 99 mmol/L (ref 96–106)
Creatinine, Ser: 0.75 mg/dL (ref 0.57–1.00)
Globulin, Total: 2.8 g/dL (ref 1.5–4.5)
Glucose: 77 mg/dL (ref 70–99)
Potassium: 4.2 mmol/L (ref 3.5–5.2)
Sodium: 138 mmol/L (ref 134–144)
Total Protein: 7.9 g/dL (ref 6.0–8.5)
eGFR: 116 mL/min/{1.73_m2} (ref >59)

## 2022-03-08 LAB — CBC WITH DIFFERENTIAL/PLATELET
Basophils Absolute: 0.1 10*3/uL (ref 0.0–0.2)
Basos: 1 %
EOS (ABSOLUTE): 0.1 10*3/uL (ref 0.0–0.4)
Eos: 1 %
Hematocrit: 46.5 % (ref 34.0–46.6)
Hemoglobin: 15.8 g/dL (ref 11.1–15.9)
Immature Grans (Abs): 0 10*3/uL (ref 0.0–0.1)
Immature Granulocytes: 0 %
Lymphocytes Absolute: 2.8 10*3/uL (ref 0.7–3.1)
Lymphs: 27 %
MCH: 29.7 pg (ref 26.6–33.0)
MCHC: 34 g/dL (ref 31.5–35.7)
MCV: 87 fL (ref 79–97)
Monocytes Absolute: 0.5 10*3/uL (ref 0.1–0.9)
Monocytes: 5 %
Neutrophils Absolute: 6.8 10*3/uL (ref 1.4–7.0)
Neutrophils: 66 %
Platelets: 190 10*3/uL (ref 150–450)
RBC: 5.32 x10E6/uL — ABNORMAL HIGH (ref 3.77–5.28)
RDW: 12.8 % (ref 11.7–15.4)
WBC: 10.3 10*3/uL (ref 3.4–10.8)

## 2022-03-08 LAB — LIPID PANEL
Chol/HDL Ratio: 4.3 ratio (ref 0.0–4.4)
Cholesterol, Total: 227 mg/dL — ABNORMAL HIGH (ref 100–199)
HDL: 53 mg/dL (ref >39)
LDL Chol Calc (NIH): 136 mg/dL — ABNORMAL HIGH (ref 0–99)
Triglycerides: 213 mg/dL — ABNORMAL HIGH (ref 0–149)
VLDL Cholesterol Cal: 38 mg/dL (ref 5–40)

## 2022-03-08 LAB — TSH: TSH: 1.94 u[IU]/mL (ref 0.450–4.500)

## 2022-03-08 LAB — HEMOGLOBIN A1C
Est. average glucose Bld gHb Est-mCnc: 103 mg/dL
Hgb A1c MFr Bld: 5.2 % (ref 4.8–5.6)

## 2022-03-08 LAB — FSH/LH
FSH: 7.8 m[IU]/mL
LH: 9 m[IU]/mL

## 2022-03-08 NOTE — Assessment & Plan Note (Signed)
Chronic, historic concern Patient reports a "bad relationship with food" and states she is a stress eater  We discussed potentially starting weight medications but I explained I would like to get her depression and anxiety in better control before initiating these -she voiced understanding and agreement She states she tries to exercise several times per week but notes her eating habits are likely not aligned with her goals Will follow up on this as her anxiety and depression are better managed

## 2022-03-08 NOTE — Assessment & Plan Note (Signed)
Chronic, ongoing, anxiety appears currently exacerbated and uncontrolled She reports she was having good progress with Wellbutrin and Celexa - will restart with Wellbutrin 150 mg PO QD and Celexa 20 mg PO QD so there is room to increase as needed Discussed therapy, she would prefer to see therapist in person, will discuss at follow up if she would like referral Suspect her current upper body pains are related to anxiety but will continue to monitor as we start medications Follow up in 4-6 weeks to assess response to medications

## 2022-03-08 NOTE — Assessment & Plan Note (Addendum)
Acute on chronic She reports she has not had menstrual cycle in almost 4 months She is not taking birth control and pregnancy tests at home have been negative Will check FSH and LH with labs along with A1c to rule out PCOS If this continues may need to refer to OB/GYN for evaluation and collaboration. Will discuss at follow up and lab results to dictate further management

## 2022-03-25 ENCOUNTER — Encounter: Payer: Self-pay | Admitting: Obstetrics and Gynecology

## 2022-04-18 ENCOUNTER — Ambulatory Visit (INDEPENDENT_AMBULATORY_CARE_PROVIDER_SITE_OTHER): Payer: Self-pay | Admitting: Physician Assistant

## 2022-04-18 ENCOUNTER — Encounter: Payer: Self-pay | Admitting: Physician Assistant

## 2022-04-18 DIAGNOSIS — F32A Depression, unspecified: Secondary | ICD-10-CM

## 2022-04-18 DIAGNOSIS — F419 Anxiety disorder, unspecified: Secondary | ICD-10-CM

## 2022-04-18 MED ORDER — BUPROPION HCL ER (XL) 150 MG PO TB24: 150.0000 mg | ORAL_TABLET | Freq: Every day | ORAL | 2 refills | Status: DC

## 2022-04-18 MED ORDER — BUPROPION HCL ER (XL) 150 MG PO TB24: 150.0000 mg | ORAL_TABLET | Freq: Every day | ORAL | 1 refills | Status: DC

## 2022-04-18 MED ORDER — CITALOPRAM HYDROBROMIDE 20 MG PO TABS: 20.0000 mg | ORAL_TABLET | Freq: Every day | ORAL | 3 refills | Status: DC

## 2022-04-18 NOTE — Assessment & Plan Note (Addendum)
Chronic, improved and appears well controlled She reports improvement in symptoms while taking her Celexa and Wellbutrin but has not had these for the past two weeks Will provide refills today - continue current regimen  Will provide referral to therapy services today  Follow up in 3 months for monitoring and medication management

## 2022-04-18 NOTE — Progress Notes (Signed)
Established Patient Office Visit  Name: Shelly Harris   MRN: [id number]    DOB: Aug 31, 2000   Date:05-08-2022  Today's Provider: Talitha Givens, MHS, PA-C Introduced myself to the patient as a PA-C and provided education on APPs in clinical practice.         Subjective  Chief Complaint  Chief Complaint  Patient presents with   Depression    Patient here for follow up today, states she has been off of medication for about 2 weeks due to having to leave them behind and not being able to go back and get them.    Anxiety    Depression        Associated symptoms include does not have insomnia and no suicidal ideas.  Past medical history includes anxiety.   Anxiety Patient reports no insomnia, nervous/anxious behavior or suicidal ideas.      Reports she was taking her Wellbutrin and Celexa and notes improvement in depression and anxiety States she had to leave a previous situation and left her medications so she has been off of those for 2 weeks    DEPRESSION Mood status: better Satisfied with current treatment?: yes Symptom severity: mild  Duration of current treatment : months Side effects: no Medication compliance: excellent compliance Psychotherapy/counseling: no  has not been able to follow up with therapy  Previous psychiatric medications: celexa and wellbutrin Depressed mood: no Anxious mood: no Anhedonia: no Significant weight loss or gain: no she has been going to the gym almost every night  Insomnia: no  no sleep concerns  Fatigue: no Feelings of worthlessness or guilt: no Impaired concentration/indecisiveness: no Suicidal ideations: no Hopelessness: no Crying spells: no    05/08/2022    2:12 PM 03/07/2022    1:18 PM 11/26/2020   11:22 AM 09/08/2020   10:26 AM 05/14/2019   10:21 AM  Depression screen PHQ 2/9  Decreased Interest 0 2 0 3 0  Down, Depressed, Hopeless 0 _0 PHQ - 2 Score 0 _1 Altered sleeping 0 1 0 3 1  Tired, decreased  energy 0 2 0 3 0  Change in appetite 0 _2 Feeling bad or failure about yourself  0 _3 Trouble concentrating 0 2 0 0 0  Moving slowly or fidgety/restless 0 0 0 1 0  Suicidal thoughts 0 0 0 0 0  PHQ-9 Score 0 _4 Difficult doing work/chores Not difficult at all Extremely dIfficult Not difficult at all Very difficult Somewhat difficult      ANXIETY/STRESS Duration:better Anxious mood: no  Excessive worrying: no Irritability: no  Sweating: no Nausea: no Palpitations:no Hyperventilation: no Panic attacks: no Agoraphobia: no  Obscessions/compulsions: no Depressed mood: no Anhedonia: no Weight changes: no Hypersomnia: no Fatigue/loss of energy: no Feelings of worthlessness: no Feelings of guilt: no Impaired concentration/indecisiveness: no Suicidal ideations: no  Crying spells: no Recent Stressors/Life Changes: no   Relationship problems:  reports she left a bad relationship   Family stress: no     Financial stress: no    Job stress: yes but this is improving    Recent death/loss: no    08-May-2022    2:12 PM 03/07/2022    1:19 PM 11/26/2020   11:23 AM 09/08/2020   10:27 AM  GAD 7 : Generalized Anxiety Score  Nervous, Anxious, on Edge 0 3 0 3  Control/stop  worrying 0 _0 Worry too much - different things 0 _1 Trouble relaxing 0 _2 Restless 0 2 0 3  Easily annoyed or irritable 0 _3 Afraid - awful might happen 0 _4 Total GAD 7 Score 0 _5 Anxiety Difficulty Not difficult at all Extremely difficult Not difficult at all Extremely difficult           Patient Active Problem List   Diagnosis Date Noted   Amenorrhea, unspecified 03/08/2022   Depression, major, single episode, moderate ([place]) 09/08/2020   Morbid obesity ([place]) 09/08/2020   Abnormal weight gain 07/04/2019   Anxiety and depression 03/25/2019    Past Surgical History:  Procedure Laterality Date   DENTAL REHABILITATION     age 60   TONSILLECTOMY AND  ADENOIDECTOMY Bilateral 02/07/2017   Procedure: TONSILLECTOMY;  Surgeon: Clyde Canterbury, MD;  Location: [facility];  Service: ENT;  Laterality: Bilateral;    Family History  Problem Relation Age of Onset   Kidney disease Mother    Cancer Father    Diabetes Maternal Grandmother    Breast cancer Neg Hx    Ovarian cancer Neg Hx     Social History   Tobacco Use   Smoking status: Never   Smokeless tobacco: Never  Substance Use Topics   Alcohol use: Never     Current Outpatient Medications:    buPROPion (WELLBUTRIN XL) 150 MG 24 hr tablet, Take 1 tablet (150 mg total) by mouth daily., Disp: 30 tablet, Rfl: 2   citalopram (CELEXA) 20 MG tablet, Take 1 tablet (20 mg total) by mouth daily., Disp: 30 tablet, Rfl: 3  No Known Allergies  I personally reviewed active problem list, medication list, allergies, health maintenance, notes from last encounter, lab results with the patient/caregiver today.   Review of Systems  Psychiatric/Behavioral:  Positive for depression. Negative for memory loss and suicidal ideas. The patient is not nervous/anxious and does not have insomnia.       Objective  Vitals:   04/18/22 1412  BP: 110/69  Pulse: 65  Temp: 98 F (36.7 C)  SpO2: 98%  Weight: 288 lb 8 oz (130.9 kg)    Body mass index is 48.01 kg/m.  Physical Exam Vitals reviewed.  Constitutional:      General: She is awake.     Appearance: Normal appearance. She is well-developed and well-groomed.  Neurological:     Mental Status: She is alert.  Psychiatric:        Behavior: Behavior is cooperative.      Recent Results (from the past 2160 hour(s))  Comp Met (CMET)     Status: Abnormal   Collection Time: 03/07/22  1:59 PM  Result Value Ref Range   Glucose 77 70 - 99 mg/dL   BUN 7 6 - 20 mg/dL   Creatinine, Ser 0.75 0.57 - 1.00 mg/dL   eGFR 116 >59 mL/min/1.73   BUN/Creatinine Ratio 9 9 - 23   Sodium 138 134 - 144 mmol/L   Potassium 4.2 3.5 - 5.2 mmol/L    Chloride 99 96 - 106 mmol/L   CO2 20 20 - 29 mmol/L   Calcium 10.3 (H) 8.7 - 10.2 mg/dL   Total Protein 7.9 6.0 - 8.5 g/dL   Albumin 5.1 (H) 4.0 - 5.0 g/dL   Globulin, Total 2.8 1.5 - 4.5 g/dL   Albumin/Globulin Ratio 1.8 1.2 - 2.2   Bilirubin Total  0.5 0.0 - 1.2 mg/dL   Alkaline Phosphatase 89 44 - 121 IU/L   AST 27 0 - 40 IU/L   ALT 33 (H) 0 - 32 IU/L  CBC w/Diff     Status: Abnormal   Collection Time: 03/07/22  1:59 PM  Result Value Ref Range   WBC 10.3 3.4 - 10.8 x10E3/uL   RBC 5.32 (H) 3.77 - 5.28 x10E6/uL   Hemoglobin 15.8 11.1 - 15.9 g/dL   Hematocrit 46.5 34.0 - 46.6 %   MCV 87 79 - 97 fL   MCH 29.7 26.6 - 33.0 pg   MCHC 34.0 31.5 - 35.7 g/dL   RDW 12.8 11.7 - 15.4 %   Platelets 190 150 - 450 x10E3/uL   Neutrophils 66 Not Estab. %   Lymphs 27 Not Estab. %   Monocytes 5 Not Estab. %   Eos 1 Not Estab. %   Basos 1 Not Estab. %   Neutrophils Absolute 6.8 1.4 - 7.0 x10E3/uL   Lymphocytes Absolute 2.8 0.7 - 3.1 x10E3/uL   Monocytes Absolute 0.5 0.1 - 0.9 x10E3/uL   EOS (ABSOLUTE) 0.1 0.0 - 0.4 x10E3/uL   Basophils Absolute 0.1 0.0 - 0.2 x10E3/uL   Immature Granulocytes 0 Not Estab. %   Immature Grans (Abs) 0.0 0.0 - 0.1 x10E3/uL  Lipid Profile     Status: Abnormal   Collection Time: 03/07/22  1:59 PM  Result Value Ref Range   Cholesterol, Total 227 (H) 100 - 199 mg/dL   Triglycerides 213 (H) 0 - 149 mg/dL   HDL 53 >39 mg/dL   VLDL Cholesterol Cal 38 5 - 40 mg/dL   LDL Chol Calc (NIH) 136 (H) 0 - 99 mg/dL   Chol/HDL Ratio 4.3 0.0 - 4.4 ratio    Comment:                                   T. Chol/HDL Ratio                                             Men  Women                               1/2 Avg.Risk  3.4    3.3                                   Avg.Risk  5.0    4.4                                2X Avg.Risk  9.6    7.1                                3X Avg.Risk 23.4   11.0   HgB A1c     Status: None   Collection Time: 03/07/22  1:59 PM  Result Value Ref Range    Hgb A1c MFr Bld 5.2 4.8 - 5.6 %    Comment:          Prediabetes: 5.7 - 6.4  Diabetes: >6.4          Glycemic control for adults with diabetes: <7.0    Est. average glucose Bld gHb Est-mCnc 103 mg/dL  TSH     Status: None   Collection Time: 03/07/22  1:59 PM  Result Value Ref Range   TSH 1.940 0.450 - 4.500 uIU/mL  FSH/LH     Status: None   Collection Time: 03/07/22  1:59 PM  Result Value Ref Range   LH 9.0 mIU/mL    Comment:                     Adult Female:                       Follicular phase      2.4 -  12.6                       Ovulation phase      14.0 -  95.6                       Luteal phase          1.0 -  11.4                       Postmenopausal        7.7 -  58.5    FSH 7.8 mIU/mL    Comment:                     Adult Female:                       Follicular phase      3.5 -  12.5                       Ovulation phase       4.7 -  21.5                       Luteal phase          1.7 -   7.7                       Postmenopausal       25.8 - 134.8      PHQ2/9:    04/18/2022    2:12 PM 03/07/2022    1:18 PM 11/26/2020   11:22 AM 09/08/2020   10:26 AM 05/14/2019   10:21 AM  Depression screen PHQ 2/9  Decreased Interest 0 2 0 3 0  Down, Depressed, Hopeless 0 _0 PHQ - 2 Score 0 _1 Altered sleeping 0 1 0 3 1  Tired, decreased energy 0 2 0 3 0  Change in appetite 0 _2 Feeling bad or failure about yourself  0 _3 Trouble concentrating 0 2 0 0 0  Moving slowly or fidgety/restless 0 0 0 1 0  Suicidal thoughts 0 0 0 0 0  PHQ-9 Score 0 _4 Difficult doing work/chores Not difficult at all Extremely dIfficult Not difficult at all Very difficult Somewhat difficult      Fall Risk:    03/07/2022    1:17 PM 09/08/2020    9:14 AM  Fall Risk  Falls in the past year? 0 0  Number falls in past yr: 0 0  Injury with Fall? 0 0  Risk for fall due to : No Fall Risks No Fall Risks  Follow up Falls evaluation completed Falls  evaluation completed      Functional Status Survey:      Assessment & Plan  Problem List Items Addressed This Visit       Other   Anxiety and depression    Chronic, improved and appears well controlled She reports improvement in symptoms while taking her Celexa and Wellbutrin but has not had these for the past two weeks Will provide refills today - continue current regimen  Will provide referral to therapy services today  Follow up in 3 months for monitoring and medication management       Relevant Medications   citalopram (CELEXA) 20 MG tablet   buPROPion (WELLBUTRIN XL) 150 MG 24 hr tablet   Other Relevant Orders   Ambulatory referral to Psychology     Return in about 3 months (around 07/19/2022) for Depression, anxiety, labs.   I, Elijha Dedman E Brance Dartt, PA-C, have reviewed all documentation for this visit. The documentation on 04/18/22 for the exam, diagnosis, procedures, and orders are all accurate and complete.   Talitha Givens, MHS, PA-C [facility]

## 2022-06-25 ENCOUNTER — Telehealth: Payer: Self-pay | Admitting: Nurse Practitioner

## 2022-06-25 DIAGNOSIS — R1032 Left lower quadrant pain: Secondary | ICD-10-CM

## 2022-06-25 NOTE — Progress Notes (Signed)
I have spent 5 minutes in review of e-visit questionnaire, review and updating patient chart, medical decision making and response to patient.  ° °Shelly Malta W Finleigh Cheong, NP ° °  °

## 2022-06-25 NOTE — Progress Notes (Signed)
It would be difficult to assess your pain without seeing you in person for an examination of your abdomen. If you are not having any nausea, vomiting, bloody diarrhea or blood stools, fever, abnormal vaginal bleeding or painful urination. I would say take tylenol or motrin for your pain. If it is almost time for your menstrual cycle it could be hormonal. If the pain worsens despite taking tylenol or motrin or you develop any of the symptoms above I would recommend a face to face visit.

## 2022-06-26 DIAGNOSIS — K929 Disease of digestive system, unspecified: Secondary | ICD-10-CM | POA: Insufficient documentation

## 2022-06-27 ENCOUNTER — Ambulatory Visit: Payer: Self-pay | Admitting: Obstetrics and Gynecology

## 2022-07-03 ENCOUNTER — Ambulatory Visit: Payer: Self-pay

## 2022-07-04 ENCOUNTER — Telehealth: Payer: Self-pay | Admitting: Physician Assistant

## 2022-07-04 DIAGNOSIS — R1032 Left lower quadrant pain: Secondary | ICD-10-CM

## 2022-07-05 ENCOUNTER — Ambulatory Visit
Admission: EM | Admit: 2022-07-05 | Discharge: 2022-07-05 | Disposition: A | Discharge status: Home / Self Care | Payer: BLUE CROSS/BLUE SHIELD | Attending: Emergency Medicine | Admitting: Emergency Medicine

## 2022-07-05 DIAGNOSIS — R1032 Left lower quadrant pain: Secondary | ICD-10-CM | POA: Diagnosis present

## 2022-07-05 LAB — URINALYSIS, W/ REFLEX TO CULTURE (INFECTION SUSPECTED)
Bilirubin Urine: NEGATIVE
Glucose, UA: NEGATIVE mg/dL
Ketones, ur: NEGATIVE mg/dL
Nitrite: NEGATIVE
Protein, ur: NEGATIVE mg/dL
Specific Gravity, Urine: 1.02 (ref 1.005–1.030)
pH: 6 (ref 5.0–8.0)

## 2022-07-05 LAB — WET PREP, GENITAL
Clue Cells Wet Prep HPF POC: NONE SEEN
Sperm: NONE SEEN
Trich, Wet Prep: NONE SEEN
WBC, Wet Prep HPF POC: >10 — AB (ref <10)
Yeast Wet Prep HPF POC: NONE SEEN

## 2022-07-05 NOTE — Discharge Instructions (Addendum)
Your testing today was negative for urinary tract infection and also vaginal infection.  The cause of your discomfort is unclear.  I recommend that you call your OB/GYN to make a follow-up appointment for further evaluation and possible pelvic ultrasound.  If you develop severe abdominal pain, fever, nausea or vomiting and cannot keep down fluids or medications please return for reevaluation or seek care in the ER.

## 2022-07-05 NOTE — ED Provider Notes (Signed)
[facility]    CSN: [id number] Arrival date & time: 07/05/22  1326      History   Chief Complaint No chief complaint on file.   HPI Shelly Harris is a 22 y.o. female.   HPI  22 year old female here for evaluation of abdominal pain.  The patient reports that she started experiencing a pinching pain in her left lower quadrant of her abdomen 2 weeks ago.  She did a virtual visit and was told that was most likely related to her upcoming menstrual cycle but that if it did not resolve with her menstrual cycle she should be evaluated.  She states that she did not notice it during her menstrual cycle but she did have some significant cramps.  The pain returned following her menstrual cycle and now is in the left lower quadrant that comes and goes in intensity and radiates through to her back and up into her left hip.  This has been associated with nausea.  She denies any fever, vomiting, pain, urgency, or frequency with urination.  She does state that the pain intensifies when she urinates.  She has not seen any blood in her urine and she denies any vaginal itching or discharge.  She has no history of ovarian cyst.  She states that yesterday she noticed that her urine was cloudy.  Past Medical History:  Diagnosis Date   Anxiety    Chlamydia 02/2019   Depression    GERD (gastroesophageal reflux disease)    Orthodontics    Invisalign   RSV (respiratory syncytial virus infection)    as infant   Seasonal allergies    Stomach ulcer     Patient Active Problem List   Diagnosis Date Noted   Amenorrhea, unspecified 03/08/2022   Depression, major, single episode, moderate ([place]) 09/08/2020   Morbid obesity ([place]) 09/08/2020   Abnormal weight gain 07/04/2019   Anxiety and depression 03/25/2019    Past Surgical History:  Procedure Laterality Date   DENTAL REHABILITATION     age 89   TONSILLECTOMY AND ADENOIDECTOMY Bilateral 02/07/2017   Procedure: TONSILLECTOMY;  Surgeon:  Clyde Canterbury, MD;  Location: [facility];  Service: ENT;  Laterality: Bilateral;    OB History     Gravida  0   Para  0   Term  0   Preterm  0   AB  0   Living  0      SAB  0   IAB  0   Ectopic  0   Multiple  0   Live Births  0            Home Medications    Prior to Admission medications   Medication Sig Start Date End Date Taking? Authorizing Provider  buPROPion (WELLBUTRIN XL) 150 MG 24 hr tablet Take 1 tablet (150 mg total) by mouth daily. 04/18/22 07/17/22  Mecum, Erin E, PA-C  citalopram (CELEXA) 20 MG tablet Take 1 tablet (20 mg total) by mouth daily. 04/18/22   Mecum, Dani Gobble, PA-C    Family History Family History  Problem Relation Age of Onset   Kidney disease Mother    Cancer Father    Diabetes Maternal Grandmother    Breast cancer Neg Hx    Ovarian cancer Neg Hx     Social History Social History   Tobacco Use   Smoking status: Never   Smokeless tobacco: Never  Vaping Use   Vaping Use: Every day  Substance Use Topics  Alcohol use: Never   Drug use: Never     Allergies   Patient has no known allergies.   Review of Systems Review of Systems  Constitutional:  Negative for fever.  Gastrointestinal:  Positive for abdominal pain and nausea. Negative for diarrhea and vomiting.  Genitourinary:  Negative for dysuria, frequency, hematuria, urgency, vaginal discharge and vaginal pain.  Musculoskeletal:  Positive for back pain.  Skin:  Negative for rash.     Physical Exam Triage Vital Signs ED Triage Vitals  Enc Vitals Group     BP 07/05/22 1522 127/80     Pulse Rate 07/05/22 1522 76     Resp --      Temp 07/05/22 1522 98.4 F (36.9 C)     Temp Source 07/05/22 1522 Oral     SpO2 07/05/22 1522 98 %     Weight 07/05/22 1521 270 lb (122.5 kg)     Height 07/05/22 1521 5\' 4"  (1.626 m)     Head Circumference --      Peak Flow --      Pain Score --      Pain Loc --      Pain Edu? --      Excl. in [place]? --    No data  found.  Updated Vital Signs BP 127/80 (BP Location: Left Arm)   Pulse 76   Temp 98.4 F (36.9 C) (Oral)   Ht 5\' 4"  (1.626 m)   Wt 270 lb (122.5 kg)   LMP 06/26/2022 (Exact Date)   SpO2 98%   BMI 46.35 kg/m   Visual Acuity Right Eye Distance:   Left Eye Distance:   Bilateral Distance:    Right Eye Near:   Left Eye Near:    Bilateral Near:     Physical Exam Vitals and nursing note reviewed.  Constitutional:      Appearance: Normal appearance. She is not ill-appearing.  Cardiovascular:     Rate and Rhythm: Normal rate and regular rhythm.     Pulses: Normal pulses.     Heart sounds: Normal heart sounds. No murmur heard.    No friction rub. No gallop.  Pulmonary:     Effort: Pulmonary effort is normal.     Breath sounds: Normal breath sounds. No wheezing, rhonchi or rales.  Abdominal:     General: Abdomen is flat.     Palpations: Abdomen is soft.     Tenderness: There is abdominal tenderness. There is no right CVA tenderness, left CVA tenderness, guarding or rebound.     Comments: Mild suprapubic tenderness as well as tenderness in the left and right adnexal region without guarding.  Skin:    General: Skin is warm and dry.     Capillary Refill: Capillary refill takes less than 2 seconds.     Findings: No erythema or rash.  Neurological:     General: No focal deficit present.     Mental Status: She is alert and oriented to person, place, and time.      UC Treatments / Results  Labs (all labs ordered are listed, but only abnormal results are displayed) Labs Reviewed  WET PREP, GENITAL - Abnormal; Notable for the following components:      Result Value   WBC, Wet Prep HPF POC >10 (*)    All other components within normal limits  URINALYSIS, W/ REFLEX TO CULTURE (INFECTION SUSPECTED) - Abnormal; Notable for the following components:   Hgb urine dipstick TRACE (*)  Leukocytes,Ua SMALL (*)    Bacteria, UA FEW (*)    All other components within normal limits     EKG   Radiology No results found.  Procedures Procedures (including critical care time)  Medications Ordered in UC Medications - No data to display  Initial Impression / Assessment and Plan / UC Course  I have reviewed the triage vital signs and the nursing notes.  Pertinent labs & imaging results that were available during my care of the patient were reviewed by me and considered in my medical decision making (see chart for details).   Patient is a very pleasant, nontoxic-appearing 22 year old female with a past medical history significant for anxiety, depression, stomach ulcer, and GERD presenting for evaluation of left lower quadrant abdominal pain that has been going on for the past 2 weeks.  She does have tenderness suprapubically and in her left and right adnexal region but the remainder of her abdomen is nontender.  She has no CVA tenderness on exam.  Despite cloudiness of her urine yesterday she has had no urinary symptoms but states the pain does increase when she urinates.  She also denies vaginal discharge or vaginal itching.  Given cloudiness of urine I will order urinalysis but I am also can order a vaginal wet prep to look for the presence of possible bacterial vaginosis which could be explaining some of the patient's symptoms.  Urinalysis shows trace hemoglobin, small leukocyte esterase, and few bacteria.  WBCs are 0 but she does have 6-10 squamous epithelials indicating this is a contaminated specimen.  Vaginal wet prep is negative for yeast, trichomonas, or clue cells.  Etiology of the patient's left lower quadrant tenderness is unclear.  There is no evidence of urine tract infection or vaginal infection.  I recommended that she make an appointment with her OB/GYN for follow-up for further evaluation and possible pelvic ultrasound to have a better understanding of possible determining cause.   Final Clinical Impressions(s) / UC Diagnoses   Final diagnoses:   Abdominal pain, left lower quadrant     Discharge Instructions      Your testing today was negative for urinary tract infection and also vaginal infection.  The cause of your discomfort is unclear.  I recommend that you call your OB/GYN to make a follow-up appointment for further evaluation and possible pelvic ultrasound.  If you develop severe abdominal pain, fever, nausea or vomiting and cannot keep down fluids or medications please return for reevaluation or seek care in the ER.     ED Prescriptions   None    PDMP not reviewed this encounter.   Margarette Canada, NP 07/05/22 [phone]

## 2022-07-05 NOTE — ED Triage Notes (Addendum)
Pt states on 06/24/22 she woke up with pinching pain LLQ, pt had done and e-visit with PCP and was told it could be coming from her menstrual. Pt reports pain is back now radiating into LT hip & LT lower back x2 days, pt states she did notice urine cloudy, denies any hematuria

## 2022-07-05 NOTE — Progress Notes (Signed)
Because you are having persistent left lower quadrant pain, I feel your condition warrants further evaluation and I recommend that you be seen in a face to face visit.   NOTE: There will be NO CHARGE for this eVisit   If you are having a true medical emergency please call 911.      For an urgent face to face visit, [facility] has eight urgent care centers for your convenience:   NEW!! [facility] [place] [facility] [place] Get Driving Directions [phone] [address] [place], [place] [facility] [place] at  Get Driving Directions [phone] [address] [place], [place] [address]   [facility]) Get Driving Directions [phone] [address], [place] [address]  [facility] [place] [facility]) Get Driving Directions [phone] [address] [place],  [place]  [address]  [place] [facility] - at [address] [place] Get Driving Directions  [phone] [address] [place],  [place] [address]   [facility] at [facility] Get Driving Directions [phone] [address] [place], [place], [place] [address]   [facility] at [facility] Get Driving Directions  [phone] [address] [place], [place] [address]   [facility] at [place] Get Driving Directions [phone] [address], [place], [place] [address]  Your MyChart E-visit questionnaire answers were reviewed by a board certified advanced clinical practitioner to complete your personal care plan based on your specific symptoms.  Thank you for using e-Visits.   I have spent 5 minutes in review of e-visit questionnaire, review and updating patient chart, medical decision making and response to patient.   Mar Daring, PA-C

## 2022-07-06 ENCOUNTER — Other Ambulatory Visit (HOSPITAL_COMMUNITY)
Admission: RE | Admit: 2022-07-06 | Discharge: 2022-07-06 | Disposition: A | Discharge status: Home / Self Care | Payer: BLUE CROSS/BLUE SHIELD | Source: Ambulatory Visit | Attending: Obstetrics and Gynecology | Admitting: Obstetrics and Gynecology

## 2022-07-06 ENCOUNTER — Ambulatory Visit (INDEPENDENT_AMBULATORY_CARE_PROVIDER_SITE_OTHER): Payer: BLUE CROSS/BLUE SHIELD | Admitting: Obstetrics and Gynecology

## 2022-07-06 ENCOUNTER — Encounter: Payer: Self-pay | Admitting: Obstetrics and Gynecology

## 2022-07-06 VITALS — BP 120/69 | HR 74 | Resp 16 | Ht 64.0 in | Wt 278.2 lb

## 2022-07-06 DIAGNOSIS — R102 Pelvic and perineal pain unspecified side: Secondary | ICD-10-CM

## 2022-07-06 DIAGNOSIS — N926 Irregular menstruation, unspecified: Secondary | ICD-10-CM | POA: Insufficient documentation

## 2022-07-06 NOTE — Progress Notes (Signed)
GYNECOLOGY PROGRESS NOTE  Subjective:    Patient ID: Shelly Harris, female    DOB: 11-May-2001, 22 y.o.   MRN: [id number]  HPI  Patient is a 22 y.o. G0P0000 female who presents for irregular menstrual cycles and pelvic pain. Notes that her periods have always been slightly irregular but over the past 6 months have been even more irregular and very heavy.  She has been having pelvic pain x 2 weeks. Pain is sharp and pinching, sometimes she feels pressure. Some days the pain is worse than other days.  Patient's last menstrual period was 06/26/2022 (exact date). She was evaluated at through Urgent Care yesterday. They tested her for vaginitis and UTI, which were both negative.  Denies concerns for STI's.    Menstrual History:  Period Duration (Days): 7-14 Period Pattern: (!) Irregular Menstrual Flow: Heavy Menstrual Control: Tampon Menstrual Control Change Freq (Hours): 1-2 Dysmenorrhea: (!) Severe Dysmenorrhea Symptoms: Cramping, Nausea        The following portions of the patient's history were reviewed and updated as appropriate:  She  has a past medical history of Anxiety, Chlamydia (02/2019), Depression, GERD (gastroesophageal reflux disease), Orthodontics, RSV (respiratory syncytial virus infection), Seasonal allergies, and Stomach ulcer. She  has a past surgical history that includes Dental rehabilitation and Tonsillectomy and adenoidectomy (Bilateral, 02/07/2017).  She  reports that she has never smoked. She has never used smokeless tobacco. She reports that she does not drink alcohol and does not use drugs.  Current Outpatient Medications on File Prior to Visit  Medication Sig Dispense Refill   buPROPion (WELLBUTRIN XL) 150 MG 24 hr tablet Take 1 tablet (150 mg total) by mouth daily. 30 tablet 2   citalopram (CELEXA) 20 MG tablet Take 1 tablet (20 mg total) by mouth daily. 30 tablet 3   No current facility-administered medications on file prior to visit.   She has No Known  Allergies..  Review of Systems Pertinent items are noted in HPI.   Objective:   Blood pressure 120/69, pulse 74, resp. rate 16, height 5\' 4"  (1.626 m), weight 278 lb 3.2 oz (126.2 kg), last menstrual period 06/26/2022. Body mass index is 47.75 kg/m. General appearance: alert, cooperative, and no distress Abdomen: soft, non-tender; bowel sounds normal; no masses,  no organomegaly Pelvic: external genitalia normal, rectovaginal septum normal.  Vagina with scant thin discharge, mild odor.  Cervix normal appearing, no lesions and no motion tenderness.  Uterus mobile, nontender, normal shape and size.  Adnexae non-palpable, nontender bilaterally.  Extremities: extremities normal, atraumatic, no cyanosis or edema Neurologic: Grossly normal   Assessment:   1. Irregular menstrual cycle   2. Pelvic pain      Plan:   1. Irregular menstrual cycle - Discussed likely cause of irregular menstrual cycles. Advised on management options with use of contraception if no desires for fertility at this time.  Patient notes that she had issues with use of birth control in the past, was tried on OCPs which caused issues with her mood.  Has been having some anxiety lately, however does report that she did stop her medications for time (Wellbutrin and Celexa).  I encouraged patient to resume her medications.  Also discussed alternatives including nonhormonal methods, progesterone only methods, or lower dose OCPs.  Also discussed option of use of Nextstellis which was more of a natural sort of OCP.  Patient willing to try this 1.  Given sample today.  Will prescribe.  To follow-up in 2 months to reassess  symptoms.  2. Pelvic pain -Unclear cause at this time, however differential includes pelvic infection and ovarian cyst.  Patient had recent wet prep which was negative, however did not have STD screening.  I discussed option for additional screening if patient was sexually active.  Patient notes she is okay to  screen today.  Will order. - US PELVIC COMPLETE WITH TRANSVAGINAL; Future     Rubie Maid, MD [place]

## 2022-07-08 ENCOUNTER — Emergency Department
Admission: EM | Admit: 2022-07-08 | Discharge: 2022-07-09 | Disposition: A | Discharge status: Home / Self Care | Payer: BLUE CROSS/BLUE SHIELD | Attending: Emergency Medicine | Admitting: Emergency Medicine

## 2022-07-08 ENCOUNTER — Other Ambulatory Visit: Payer: Self-pay

## 2022-07-08 ENCOUNTER — Other Ambulatory Visit: Payer: Self-pay | Admitting: Obstetrics and Gynecology

## 2022-07-08 ENCOUNTER — Telehealth: Payer: BLUE CROSS/BLUE SHIELD | Admitting: Physician Assistant

## 2022-07-08 DIAGNOSIS — B379 Candidiasis, unspecified: Secondary | ICD-10-CM | POA: Diagnosis not present

## 2022-07-08 DIAGNOSIS — M25552 Pain in left hip: Secondary | ICD-10-CM | POA: Diagnosis not present

## 2022-07-08 DIAGNOSIS — R102 Pelvic and perineal pain: Secondary | ICD-10-CM | POA: Diagnosis present

## 2022-07-08 DIAGNOSIS — M545 Low back pain, unspecified: Secondary | ICD-10-CM | POA: Diagnosis not present

## 2022-07-08 DIAGNOSIS — M79605 Pain in left leg: Secondary | ICD-10-CM

## 2022-07-08 LAB — URINALYSIS, ROUTINE W REFLEX MICROSCOPIC
Bacteria, UA: NONE SEEN
Bilirubin Urine: NEGATIVE
Glucose, UA: NEGATIVE mg/dL
Hgb urine dipstick: NEGATIVE
Ketones, ur: NEGATIVE mg/dL
Nitrite: NEGATIVE
Protein, ur: NEGATIVE mg/dL
Specific Gravity, Urine: 1.017 (ref 1.005–1.030)
pH: 7 (ref 5.0–8.0)

## 2022-07-08 LAB — CERVICOVAGINAL ANCILLARY ONLY
Candida Glabrata: POSITIVE — AB
Candida Vaginitis: NEGATIVE
Chlamydia: NEGATIVE
Comment: NEGATIVE
Comment: NEGATIVE
Comment: NEGATIVE
Comment: NEGATIVE
Comment: NORMAL
Neisseria Gonorrhea: NEGATIVE
Trichomonas: NEGATIVE

## 2022-07-08 LAB — POC URINE PREG, ED: Preg Test, Ur: NEGATIVE

## 2022-07-08 LAB — WET PREP, GENITAL
Clue Cells Wet Prep HPF POC: NONE SEEN
Sperm: NONE SEEN
Trich, Wet Prep: NONE SEEN
WBC, Wet Prep HPF POC: >=10 — AB (ref <10)
Yeast Wet Prep HPF POC: NONE SEEN

## 2022-07-08 MED ORDER — FLUCONAZOLE 150 MG PO TABS: 150.0000 mg | ORAL_TABLET | Freq: Once | ORAL | 0 refills | Status: AC

## 2022-07-08 MED ORDER — TERCONAZOLE 0.8 % VA CREA: 1.0000 | TOPICAL_CREAM | Freq: Every day | VAGINAL | 0 refills | Status: DC

## 2022-07-08 NOTE — ED Triage Notes (Addendum)
Pt to ED via POV c/o pelvic pain. Pt states has been going on for 2 weeks. Went to [facility] Wednesday and tested positive for yeast infection. Pt still having pelvic pain but is now afraid infection is in bloodstream because she looked it up on internet. Pt in NAD at this time.

## 2022-07-08 NOTE — Progress Notes (Signed)
E-Visit for Vaginal Symptoms  We are sorry that you are not feeling well. Here is how we plan to help! Based on what you shared with me it looks like you: May have a yeast vaginosis due to Candida Glabrata.  Vaginosis is an inflammation of the vagina that can result in discharge, itching and pain. The cause is usually a change in the normal balance of vaginal bacteria or an infection. Vaginosis can also result from reduced estrogen levels after menopause.  The most common causes of vaginosis are:   Bacterial vaginosis which results from an overgrowth of one on several organisms that are normally present in your vagina.   Yeast infections which are caused by a naturally occurring fungus called candida.   Vaginal atrophy (atrophic vaginosis) which results from the thinning of the vagina from reduced estrogen levels after menopause.   Trichomoniasis which is caused by a parasite and is commonly transmitted by sexual intercourse.  Factors that increase your risk of developing vaginosis include: Medications, such as antibiotics and steroids Uncontrolled diabetes Use of hygiene products such as bubble bath, vaginal spray or vaginal deodorant Douching Wearing damp or tight-fitting clothing Using an intrauterine device (IUD) for birth control Hormonal changes, such as those associated with pregnancy, birth control pills or menopause Sexual activity Having a sexually transmitted infection  Your treatment plan is A single Diflucan (fluconazole) 186m tablet once.  I have also sent Terconazole topical cream to use as well. I have electronically sent this prescription into the pharmacy that you have chosen.  Some studies show also using Boric Acid suppositories can be beneficial in treating Candida Glabrata as well. These can be purchased OTC at any pharmacy, [employer] brand does make these.   Be sure to take all of the medication as directed. Stop taking any medication if you develop a rash, tongue  swelling or shortness of breath. Mothers who are breast feeding should consider pumping and discarding their breast milk while on these antibiotics. However, there is no consensus that infant exposure at these doses would be harmful.  Remember that medication creams can weaken latex condoms.   HOME CARE:  Good hygiene may prevent some types of vaginosis from recurring and may relieve some symptoms:  Avoid baths, hot tubs and whirlpool spas. Rinse soap from your outer genital area after a shower, and dry the area well to prevent irritation. Don't use scented or harsh soaps, such as those with deodorant or antibacterial action. Avoid irritants. These include scented tampons and pads. Wipe from front to back after using the toilet. Doing so avoids spreading fecal bacteria to your vagina.  Other things that may help prevent vaginosis include:  Don't douche. Your vagina doesn't require cleansing other than normal bathing. Repetitive douching disrupts the normal organisms that reside in the vagina and can actually increase your risk of vaginal infection. Douching won't clear up a vaginal infection. Use a latex condom. Both female and female latex condoms may help you avoid infections spread by sexual contact. Wear cotton underwear. Also wear pantyhose with a cotton crotch. If you feel comfortable without it, skip wearing underwear to bed. Yeast thrives in mCampbell SoupYour symptoms should improve in the next day or two.  GET HELP RIGHT AWAY IF:  You have pain in your lower abdomen ( pelvic area or over your ovaries) You develop nausea or vomiting You develop a fever Your discharge changes or worsens You have persistent pain with intercourse You develop shortness of breath, a rapid  pulse, or you faint.  These symptoms could be signs of problems or infections that need to be evaluated by a medical provider now.  MAKE SURE YOU   Understand these instructions. Will watch your  condition. Will get help right away if you are not doing well or get worse.  Thank you for choosing an e-visit.  Your e-visit answers were reviewed by a board certified advanced clinical practitioner to complete your personal care plan. Depending upon the condition, your plan could have included both over the counter or prescription medications.  Please review your pharmacy choice. Make sure the pharmacy is open so you can pick up prescription now. If there is a problem, you may contact your provider through [employer] and have the prescription routed to another pharmacy.  Your safety is important to [place]. If you have drug allergies check your prescription carefully.   For the next 24 hours you can use MyChart to ask questions about today's visit, request a non-urgent call back, or ask for a work or school excuse. You will get an email in the next two days asking about your experience. I hope that your e-visit has been valuable and will speed your recovery.  I have spent 5 minutes in review of e-visit questionnaire, review and updating patient chart, medical decision making and response to patient.   Mar Daring, PA-C

## 2022-07-08 NOTE — Progress Notes (Signed)
Paityn,  If you feel that you need an in person evaluation please visit one of the Emergency rooms below. Unfortunately our services close at 8pm so we will not be available to respond until 8am.      If you are having a true medical emergency please call 911.      Emergency [facility]  Get Driving Directions  [id number]  [address]  [place], [place] [address]  Open 24/7/365      [facility] Emergency Department at [place]  [id number]  [place], [place] [address]  Open 24/7/365    Emergency [facility]  Get Driving Directions  [id number]  [address], [place] [address]  Open 24/7/365      [facility]  Get Driving Directions  [id number]  [address]  [place], [place] [address]  Open 24/7/365    [facility]  Emergency [place]  Get Driving Directions  [id number]  [place], [place] [address]  Open 24/7/365    [place]  Get Driving Directions  [id number]  [place], [place] [address]  Open 24/7/365    [facility]  Emergency [facility]  Get Driving Directions  [id number]  [address]  [place],  [address]  Open 24/7/365

## 2022-07-09 LAB — CHLAMYDIA/NGC RT PCR (ARMC ONLY)
Chlamydia Tr: NOT DETECTED
N gonorrhoeae: NOT DETECTED

## 2022-07-09 NOTE — Discharge Instructions (Signed)
Please take Tylenol and ibuprofen/Advil for your pain.  It is safe to take them together, or to alternate them every few hours.  Take up to 1048m of Tylenol at a time, up to 4 times per day.  Do not take more than 4000 mg of Tylenol in 24 hours.  For ibuprofen, take 400-600 mg, 3 - 4 times per day.  Stop googling your symptoms. You are not dying.

## 2022-07-09 NOTE — ED Provider Notes (Signed)
[facility] Provider Note    Event Date/Time   First MD Initiated Contact with Patient 07/08/22 2350     (approximate)   History   Pelvic Pain   HPI  Shelly Harris is a 22 y.o. female who presents to the ED for evaluation of Pelvic Pain   I reviewed 2/7 outpatient GYN visit. G0 seen for irregular periods and subacute pelvic pain.  Patient presents to the ED, accompanied by her mother, for evaluation of "I think I am dying."  She reports googling her various symptoms and questions, as well as reading about Candida glabrata.  She shows me on her MyChart that she had a positive swab at her OB/GYN for Candida glabrata and was given treatment for this by her OB, but reports concern that it might of gotten into her bloodstream and is killing her.  She cannot elucidate why she is concerned about this other than the fear of dying.  She reports left hip pain radiating from her left lower back, that she attributes to starting to work out recently and having lost 15 pounds since going to the gym regularly the past few weeks.  No red flag features  Physical Exam   Triage Vital Signs: ED Triage Vitals [07/08/22 2159]  Enc Vitals Group     BP (!) 147/104     Pulse Rate 76     Resp 18     Temp 98.1 F (36.7 C)     Temp Source Oral     SpO2 100 %     Weight      Height      Head Circumference      Peak Flow      Pain Score 4     Pain Loc      Pain Edu?      Excl. in [place]?     Most recent vital signs: Vitals:   07/08/22 2159  BP: (!) 147/104  Pulse: 76  Resp: 18  Temp: 98.1 F (36.7 C)  SpO2: 100%    General: Awake, no distress.  Morbidly obese, ambulatory.  Looks well.  Does seem somewhat anxious, but is linear thought processes CV:  Good peripheral perfusion.  Resp:  Normal effort.  Abd:  No distention.  MSK:  No deformity noted.  Some mild paraspinal left-sided lumbar tenderness that wraps around to her left hip and reproduces her sharp  pains. Neuro:  No focal deficits appreciated. Other:     ED Results / Procedures / Treatments   Labs (all labs ordered are listed, but only abnormal results are displayed) Labs Reviewed  WET PREP, GENITAL - Abnormal; Notable for the following components:      Result Value   WBC, Wet Prep HPF POC >=10 (*)    All other components within normal limits  URINALYSIS, ROUTINE W REFLEX MICROSCOPIC - Abnormal; Notable for the following components:   Color, Urine YELLOW (*)    APPearance HAZY (*)    Leukocytes,Ua MODERATE (*)    All other components within normal limits  CHLAMYDIA/NGC RT PCR ([facility] ONLY)            POC URINE PREG, ED    EKG   RADIOLOGY   Official radiology report(s): No results found.  PROCEDURES and INTERVENTIONS:  Procedures  Medications - No data to display   IMPRESSION / MDM / [place] / ED COURSE  I reviewed the triage vital signs and the nursing notes.  Differential diagnosis includes, but is not limited to, anxiety, malingering, sepsis, STD  Patient presents to the ED with anxiety and concerns for impending death, without evidence of such and suitable for outpatient management.  No indication for IVC or emergent psychiatric evaluation.  I provided reassurance and she reports relief.  We discussed local Candida infections.  We discussed appropriate treatment and return precautions.      FINAL CLINICAL IMPRESSION(S) / ED DIAGNOSES   Final diagnoses:  None     Rx / DC Orders   ED Discharge Orders     None        Note:  This document was prepared using Dragon voice recognition software and may include unintentional dictation errors.   Vladimir Crofts, MD 07/09/22 [phone]

## 2022-07-11 ENCOUNTER — Telehealth: Payer: Self-pay

## 2022-07-11 NOTE — Transitions of Care (Post Inpatient/ED Visit) (Unsigned)
   07/11/2022  Name: Shelly Harris MRN: [id number] DOB: 08-Sep-2000  Today's TOC FU Call Status: Unsuccessful Call (1st Attempt) Date: 07/11/22  Attempted to reach the patient regarding the most recent Inpatient/ED visit.  Follow Up Plan: Additional outreach attempts will be made to reach the patient to complete the Transitions of Care (Post Inpatient/ED visit) call.   Signature: Yvonna Alanis, [place]

## 2022-07-12 NOTE — Transitions of Care (Post Inpatient/ED Visit) (Signed)
   07/12/2022  Name: Shelly Harris MRN: [id number] DOB: 2000/07/15  Today's TOC FU Call Status: Today's TOC FU Call Status:: Successful TOC FU Call Competed Unsuccessful Call (1st Attempt) Date: 07/11/22 [facility] FU Call Complete Date: 07/12/22  Transition Care Management Follow-up Telephone Call Date of Discharge: 07/08/22 Discharge Facility: [facility] Type of Discharge: Emergency Department Reason for ED Visit: Mental or Mood Disorder Mental or Mood Disorder Diagnosis: Anxiety How have you been since you were released from the hospital?: Worse Any questions or concerns?: Yes Patient Questions/Concerns:: "I am feeling worse than I did, the fear of dying." Patient Questions/Concerns Addressed: Notified Provider of Patient Questions/Concerns  Items Reviewed: Did you receive and understand the discharge instructions provided?: Yes Medications obtained and verified?: Yes (Medications Reviewed) Any new allergies since your discharge?: No Dietary orders reviewed?: NA Do you have support at home?: Yes People in Home: parent(s) Name of Support/Comfort Primary Source: mother  Home Care and Equipment/Supplies: [employer] Ordered?: NA Any new equipment or medical supplies ordered?: NA  Functional Questionnaire: Do you need assistance with bathing/showering or dressing?: No Do you need assistance with meal preparation?: No Do you need assistance with eating?: No Do you have difficulty maintaining continence: No Do you need assistance with getting out of bed/getting out of a chair/moving?: No Do you have difficulty managing or taking your medications?: No  Folllow up appointments reviewed: PCP Follow-up appointment confirmed?: Yes Date of PCP follow-up appointment?: 07/13/22 Follow-up Provider: Jon Billings, NP [facility] Follow-up appointment confirmed?: NA Do you need transportation to your follow-up appointment?: No Do you understand care options if your  condition(s) worsen?: Yes-patient verbalized understanding    SIGNATURE: Yvonna Alanis, [place]

## 2022-07-12 NOTE — Telephone Encounter (Signed)
Pt is calling back to speak with [place].  Please call pt back.

## 2022-07-13 ENCOUNTER — Encounter: Payer: Self-pay | Admitting: Obstetrics and Gynecology

## 2022-07-13 ENCOUNTER — Encounter: Payer: Self-pay | Admitting: Nurse Practitioner

## 2022-07-13 ENCOUNTER — Ambulatory Visit
Admission: RE | Admit: 2022-07-13 | Discharge: 2022-07-13 | Disposition: A | Discharge status: Home / Self Care | Payer: BLUE CROSS/BLUE SHIELD | Source: Ambulatory Visit | Attending: Nurse Practitioner | Admitting: Nurse Practitioner

## 2022-07-13 ENCOUNTER — Ambulatory Visit (INDEPENDENT_AMBULATORY_CARE_PROVIDER_SITE_OTHER): Payer: Self-pay | Admitting: Nurse Practitioner

## 2022-07-13 ENCOUNTER — Ambulatory Visit
Admission: RE | Admit: 2022-07-13 | Discharge: 2022-07-13 | Disposition: A | Discharge status: Home / Self Care | Payer: BLUE CROSS/BLUE SHIELD | Attending: Nurse Practitioner | Admitting: Nurse Practitioner

## 2022-07-13 VITALS — BP 125/76 | HR 75 | Temp 98.2°F | Wt 266.1 lb

## 2022-07-13 DIAGNOSIS — F419 Anxiety disorder, unspecified: Secondary | ICD-10-CM

## 2022-07-13 DIAGNOSIS — R041 Hemorrhage from throat: Secondary | ICD-10-CM | POA: Diagnosis present

## 2022-07-13 DIAGNOSIS — R109 Unspecified abdominal pain: Secondary | ICD-10-CM

## 2022-07-13 DIAGNOSIS — E7800 Pure hypercholesterolemia, unspecified: Secondary | ICD-10-CM

## 2022-07-13 DIAGNOSIS — R300 Dysuria: Secondary | ICD-10-CM

## 2022-07-13 DIAGNOSIS — B379 Candidiasis, unspecified: Secondary | ICD-10-CM

## 2022-07-13 DIAGNOSIS — F32A Depression, unspecified: Secondary | ICD-10-CM

## 2022-07-13 DIAGNOSIS — F321 Major depressive disorder, single episode, moderate: Secondary | ICD-10-CM

## 2022-07-13 NOTE — Progress Notes (Signed)
Hi Shelly Harris. Your chest xray was normal.  If the blood is persistent please let me know, I will refer you to Ear, Nose and throat for further evaluation.

## 2022-07-13 NOTE — Assessment & Plan Note (Signed)
Chronic. Not well controlled.  Restarted her Wellbutrin and Celexa 4 days ago.  Continue with medications.  Follow up in 2 weeks.  If having SI should be seen in the ER for further evaluation.

## 2022-07-13 NOTE — Progress Notes (Signed)
BP 125/76   Pulse 75   Temp 98.2 F (36.8 C) (Oral)   Wt 266 lb 1.6 oz (120.7 kg)   LMP 06/26/2022 (Exact Date)   SpO2 98%   BMI 45.68 kg/m    Subjective:    Patient ID: Shelly Harris, female    DOB: [birth year], 22 y.o.   MRN: [id number]  HPI: Shelly Harris is a 22 y.o. female  Chief Complaint  Patient presents with   ER Follow Up   Anxiety   Depression   DEPRESSION/ANXIETY Patient presents to clinic following ER visit for anxiety.  She has been very anxious over her health. She was seen with GYN and her swab showed candida which she has been treated for vaginally and orally.  She is nervous this will cause a full body infection.  Would like to be checked for diabetes since this can cause yeast, have her cholesterol checked, and any other blood work that would help ease her mind.      [place] Office Visit from 07/13/2022 in [place]  PHQ-9 Total Score 24         07/13/2022   10:07 AM 04/18/2022    2:12 PM 03/07/2022    1:19 PM 11/26/2020   11:23 AM  GAD 7 : Generalized Anxiety Score  Nervous, Anxious, on Edge 3 0 3 0  Control/stop worrying 3 0 3 1  Worry too much - different things 3 0 3 1  Trouble relaxing 3 0 3 1  Restless 3 0 2 0  Easily annoyed or irritable 3 0 2 1  Afraid - awful might happen 3 0 3 1  Total GAD 7 Score 21 0 19 5  Anxiety Difficulty Extremely difficult Not difficult at all Extremely difficult Not difficult at all      Relevant past medical, surgical, family and social history reviewed and updated as indicated. Interim medical history since our last visit reviewed. Allergies and medications reviewed and updated.  Review of Systems  Psychiatric/Behavioral:  Positive for dysphoric mood. The patient is nervous/anxious.     Per HPI unless specifically indicated above     Objective:    BP 125/76   Pulse 75   Temp 98.2 F (36.8 C) (Oral)   Wt 266 lb 1.6 oz (120.7 kg)   LMP 06/26/2022 (Exact Date)    SpO2 98%   BMI 45.68 kg/m   Wt Readings from Last 3 Encounters:  07/13/22 266 lb 1.6 oz (120.7 kg)  07/06/22 278 lb 3.2 oz (126.2 kg)  07/05/22 270 lb (122.5 kg)    Physical Exam Vitals and nursing note reviewed.  Constitutional:      General: She is not in acute distress.    Appearance: Normal appearance. She is normal weight. She is not ill-appearing, toxic-appearing or diaphoretic.  HENT:     Head: Normocephalic.     Right Ear: External ear normal.     Left Ear: External ear normal.     Nose: Nose normal.     Mouth/Throat:     Mouth: Mucous membranes are moist.     Palate: No mass and lesions.     Pharynx: Oropharynx is clear. Posterior oropharyngeal erythema and uvula swelling present. No pharyngeal swelling or oropharyngeal exudate.     Tonsils: No tonsillar exudate or tonsillar abscesses.  Eyes:     General:        Right eye: No discharge.  Left eye: No discharge.     Extraocular Movements: Extraocular movements intact.     Conjunctiva/sclera: Conjunctivae normal.     Pupils: Pupils are equal, round, and reactive to light.  Cardiovascular:     Rate and Rhythm: Normal rate and regular rhythm.     Heart sounds: No murmur heard. Pulmonary:     Effort: Pulmonary effort is normal. No respiratory distress.     Breath sounds: Normal breath sounds. No wheezing or rales.  Musculoskeletal:     Cervical back: Normal range of motion and neck supple.  Skin:    General: Skin is warm and dry.     Capillary Refill: Capillary refill takes less than 2 seconds.  Neurological:     General: No focal deficit present.     Mental Status: She is alert and oriented to person, place, and time. Mental status is at baseline.  Psychiatric:        Mood and Affect: Mood normal.        Behavior: Behavior normal.        Thought Content: Thought content normal.        Judgment: Judgment normal.     Results for orders placed or performed during the hospital encounter of 07/08/22  Wet  prep, genital   Specimen: Vaginal  Result Value Ref Range   Yeast Wet Prep HPF POC NONE SEEN NONE SEEN   Trich, Wet Prep NONE SEEN NONE SEEN   Clue Cells Wet Prep HPF POC NONE SEEN NONE SEEN   WBC, Wet Prep HPF POC >=10 (A) <10   Sperm NONE SEEN   Chlamydia/NGC rt PCR ([facility] only)   Specimen: Cervical/Vaginal swab; Genital  Result Value Ref Range   Specimen source GC/Chlam ENDOCERVICAL    Chlamydia Tr NOT DETECTED NOT DETECTED   N gonorrhoeae NOT DETECTED NOT DETECTED  Urinalysis, Routine w reflex microscopic -Urine, Clean Catch  Result Value Ref Range   Color, Urine YELLOW (A) YELLOW   APPearance HAZY (A) CLEAR   Specific Gravity, Urine 1.017 1.005 - 1.030   pH 7.0 5.0 - 8.0   Glucose, UA NEGATIVE NEGATIVE mg/dL   Hgb urine dipstick NEGATIVE NEGATIVE   Bilirubin Urine NEGATIVE NEGATIVE   Ketones, ur NEGATIVE NEGATIVE mg/dL   Protein, ur NEGATIVE NEGATIVE mg/dL   Nitrite NEGATIVE NEGATIVE   Leukocytes,Ua MODERATE (A) NEGATIVE   RBC / HPF 0-5 0 - 5 RBC/hpf   WBC, UA 6-10 0 - 5 WBC/hpf   Bacteria, UA NONE SEEN NONE SEEN   Squamous Epithelial / HPF 6-10 0 - 5 /HPF   Mucus PRESENT   POC urine preg, ED  Result Value Ref Range   Preg Test, Ur NEGATIVE NEGATIVE      Assessment & Plan:   Problem List Items Addressed This Visit       Other   Anxiety and depression    Chronic. Not well controlled.  Restarted her Wellbutrin and Celexa 4 days ago.  Continue with medications.  Follow up in 2 weeks.  If having SI should be seen in the ER for further evaluation.       Relevant Orders   Comp Met (CMET)   CBC w/Diff   Depression, major, single episode, moderate (HCC) - Primary    Chronic. Not well controlled.  Restarted her Wellbutrin and Celexa 4 days ago.  Continue with medications.  Follow up in 2 weeks.  If having SI should be seen in the ER for further evaluation.  Relevant Orders   Comp Met (CMET)   CBC w/Diff   Other Visit Diagnoses     Candida glabrata infection        Has already been treated with oral Diflucan and vaginal. Will repeat swab at next visit to ensure it has cleared.   Relevant Orders   Urinalysis, Routine w reflex microscopic   Dysuria       Labs ordered today. Will make recommendations based on lab results.   Relevant Orders   Urinalysis, Routine w reflex microscopic   HgB A1c   Urine Culture   Elevated LDL cholesterol level       Relevant Orders   Lipid Profile   Blood in throat       Whilte patient was getting labs drawn she had blood in her throat. Not able to swab her throat due to supplies. Will order chest xray for evaluation.   Relevant Orders   DG Chest 2 View        Follow up plan: Return in about 2 weeks (around 07/27/2022) for Depression/Anxiety FU.

## 2022-07-14 ENCOUNTER — Other Ambulatory Visit: Payer: Self-pay

## 2022-07-14 ENCOUNTER — Ambulatory Visit
Admission: RE | Admit: 2022-07-14 | Discharge: 2022-07-14 | Disposition: A | Discharge status: Home / Self Care | Payer: No Typology Code available for payment source | Source: Ambulatory Visit | Attending: Obstetrics and Gynecology | Admitting: Obstetrics and Gynecology

## 2022-07-14 DIAGNOSIS — F321 Major depressive disorder, single episode, moderate: Secondary | ICD-10-CM

## 2022-07-14 DIAGNOSIS — R102 Pelvic and perineal pain: Secondary | ICD-10-CM

## 2022-07-14 DIAGNOSIS — F32A Depression, unspecified: Secondary | ICD-10-CM

## 2022-07-14 DIAGNOSIS — R300 Dysuria: Secondary | ICD-10-CM

## 2022-07-14 DIAGNOSIS — N926 Irregular menstruation, unspecified: Secondary | ICD-10-CM

## 2022-07-14 LAB — CBC WITH DIFFERENTIAL/PLATELET

## 2022-07-14 LAB — SPECIMEN STATUS REPORT

## 2022-07-14 MED ORDER — NITROFURANTOIN MONOHYD MACRO 100 MG PO CAPS: 100.0000 mg | ORAL_CAPSULE | Freq: Two times a day (BID) | ORAL | 0 refills | Status: DC

## 2022-07-14 NOTE — Progress Notes (Signed)
Hi Shelly Harris.  Your lab work shows that your cholesterol has improved from prior.  Your triglycerides and LDL (bad cholesterol) and total cholesterol have all improved.  Your urine does look like you have a UTI.  I have sent in a prescription for macrobid which is an antibiotic that will treat this.  I also ordered a culture which will tell us which bacteria it is and make sure the antibiotic will treat this.

## 2022-07-14 NOTE — Addendum Note (Signed)
Addended by: Jon Billings on: 07/14/2022 08:04 AM   Modules accepted: Orders

## 2022-07-15 LAB — CBC WITH DIFFERENTIAL/PLATELET
Basophils Absolute: 0.1 10*3/uL (ref 0.0–0.2)
Basos: 1 %
EOS (ABSOLUTE): 0.1 10*3/uL (ref 0.0–0.4)
Eos: 1 %
Hematocrit: 45.2 % (ref 34.0–46.6)
Hemoglobin: 15.7 g/dL (ref 11.1–15.9)
Immature Grans (Abs): 0 10*3/uL (ref 0.0–0.1)
Immature Granulocytes: 0 %
Lymphocytes Absolute: 1.8 10*3/uL (ref 0.7–3.1)
Lymphs: 19 %
MCH: 30 pg (ref 26.6–33.0)
MCHC: 34.7 g/dL (ref 31.5–35.7)
MCV: 86 fL (ref 79–97)
Monocytes Absolute: 0.7 10*3/uL (ref 0.1–0.9)
Monocytes: 7 %
Neutrophils Absolute: 6.8 10*3/uL (ref 1.4–7.0)
Neutrophils: 72 %
Platelets: 330 10*3/uL (ref 150–450)
RBC: 5.24 x10E6/uL (ref 3.77–5.28)
RDW: 12.2 % (ref 11.7–15.4)
WBC: 9.4 10*3/uL (ref 3.4–10.8)

## 2022-07-15 LAB — HEMOGLOBIN A1C
Est. average glucose Bld gHb Est-mCnc: 105 mg/dL
Hgb A1c MFr Bld: 5.3 % (ref 4.8–5.6)

## 2022-07-15 NOTE — Progress Notes (Signed)
Good Morning Shelly Harris.  Your complete blood count is normal.  No evidence of infection or anemia.  Your A1c is well within the normal range.  No evidence of diabetes.

## 2022-07-16 LAB — MICROSCOPIC EXAMINATION
Casts: NONE SEEN /lpf
Epithelial Cells (non renal): >10 /hpf — AB (ref 0–10)
RBC, Urine: NONE SEEN /hpf (ref 0–2)

## 2022-07-16 LAB — URINALYSIS, ROUTINE W REFLEX MICROSCOPIC
Glucose, UA: NEGATIVE
Nitrite, UA: NEGATIVE
RBC, UA: NEGATIVE
Specific Gravity, UA: >=1.03 — AB (ref 1.005–1.030)
Urobilinogen, Ur: 1 mg/dL (ref 0.2–1.0)
pH, UA: 6 (ref 5.0–7.5)

## 2022-07-16 LAB — URINE CULTURE

## 2022-07-18 NOTE — Progress Notes (Signed)
Good Morning Shelly Harris.  Your urine culture didn't end up growing any bacteria meaning you do not have a UTI.  Make sure you are drinking plenty of water daily.

## 2022-07-19 ENCOUNTER — Ambulatory Visit (INDEPENDENT_AMBULATORY_CARE_PROVIDER_SITE_OTHER): Payer: BLUE CROSS/BLUE SHIELD

## 2022-07-19 ENCOUNTER — Ambulatory Visit: Payer: Self-pay | Admitting: Nurse Practitioner

## 2022-07-19 ENCOUNTER — Other Ambulatory Visit (HOSPITAL_COMMUNITY)
Admission: RE | Admit: 2022-07-19 | Discharge: 2022-07-19 | Disposition: A | Discharge status: Home / Self Care | Payer: BLUE CROSS/BLUE SHIELD | Source: Ambulatory Visit | Attending: Obstetrics and Gynecology | Admitting: Obstetrics and Gynecology

## 2022-07-19 VITALS — BP 114/60 | Ht 64.0 in | Wt 261.0 lb

## 2022-07-19 DIAGNOSIS — B3731 Acute candidiasis of vulva and vagina: Secondary | ICD-10-CM | POA: Diagnosis not present

## 2022-07-19 DIAGNOSIS — B379 Candidiasis, unspecified: Secondary | ICD-10-CM

## 2022-07-19 NOTE — Progress Notes (Signed)
    NURSE VISIT NOTE  Subjective:    Patient ID: Shelly Harris, female    DOB: [birth year], 22 y.o.   MRN: [id number]  HPI  Patient is a 22 y.o. G0P0000 female who presents for self swab to retest for candida glabrata. She wants to make sure infection is completely gone after treatment.  Denies abnormal vaginal bleeding or significant pelvic pain or fever.  Objective:    BP 114/60   Ht 5' 4"$  (1.626 m)   Wt 261 lb (118.4 kg)   LMP 06/26/2022 (Exact Date)   BMI 44.80 kg/m     Assessment:   1. Infection due to Candida glabrata     C. glabrata vulvovaginitis  Plan:   Probe sent to lab. Treatment: will wait for results. ROV prn if symptoms persist or worsen.   Drenda Freeze, CMA

## 2022-07-19 NOTE — Patient Instructions (Signed)

## 2022-07-20 LAB — CERVICOVAGINAL ANCILLARY ONLY
Bacterial Vaginitis (gardnerella): NEGATIVE
Candida Glabrata: POSITIVE — AB
Candida Vaginitis: NEGATIVE
Comment: NEGATIVE
Comment: NEGATIVE
Comment: NEGATIVE

## 2022-07-21 ENCOUNTER — Ambulatory Visit: Payer: Self-pay | Admitting: Obstetrics and Gynecology

## 2022-07-21 ENCOUNTER — Encounter: Payer: Self-pay | Admitting: Nurse Practitioner

## 2022-07-23 ENCOUNTER — Encounter: Payer: Self-pay | Admitting: Nurse Practitioner

## 2022-07-26 NOTE — Progress Notes (Deleted)
LMP 06/26/2022 (Exact Date)    Subjective:    Patient ID: Shelly Harris, female    DOB: [birth year], 22 y.o.   MRN: [id number]  HPI: Shelly Harris is a 22 y.o. female  No chief complaint on file.  DEPRESSION/ANXIETY Patient presents to clinic following ER visit for anxiety.  She has been very anxious over her health. She was seen with GYN and her swab showed candida which she has been treated for vaginally and orally.  She is nervous this will cause a full body infection.  Would like to be checked for diabetes since this can cause yeast, have her cholesterol checked, and any other blood work that would help ease her mind.      [place] Office Visit from 07/13/2022 in [place]  PHQ-9 Total Score 24         07/13/2022   10:07 AM 04/18/2022    2:12 PM 03/07/2022    1:19 PM 11/26/2020   11:23 AM  GAD 7 : Generalized Anxiety Score  Nervous, Anxious, on Edge 3 0 3 0  Control/stop worrying 3 0 3 1  Worry too much - different things 3 0 3 1  Trouble relaxing 3 0 3 1  Restless 3 0 2 0  Easily annoyed or irritable 3 0 2 1  Afraid - awful might happen 3 0 3 1  Total GAD 7 Score 21 0 19 5  Anxiety Difficulty Extremely difficult Not difficult at all Extremely difficult Not difficult at all      Relevant past medical, surgical, family and social history reviewed and updated as indicated. Interim medical history since our last visit reviewed. Allergies and medications reviewed and updated.  Review of Systems  Psychiatric/Behavioral:  Positive for dysphoric mood. The patient is nervous/anxious.     Per HPI unless specifically indicated above     Objective:    LMP 06/26/2022 (Exact Date)   Wt Readings from Last 3 Encounters:  07/19/22 261 lb (118.4 kg)  07/13/22 266 lb 1.6 oz (120.7 kg)  07/06/22 278 lb 3.2 oz (126.2 kg)    Physical Exam Vitals and nursing note reviewed.  Constitutional:      General: She is not in acute distress.     Appearance: Normal appearance. She is normal weight. She is not ill-appearing, toxic-appearing or diaphoretic.  HENT:     Head: Normocephalic.     Right Ear: External ear normal.     Left Ear: External ear normal.     Nose: Nose normal.     Mouth/Throat:     Mouth: Mucous membranes are moist.     Palate: No mass and lesions.     Pharynx: Oropharynx is clear. Posterior oropharyngeal erythema and uvula swelling present. No pharyngeal swelling or oropharyngeal exudate.     Tonsils: No tonsillar exudate or tonsillar abscesses.  Eyes:     General:        Right eye: No discharge.        Left eye: No discharge.     Extraocular Movements: Extraocular movements intact.     Conjunctiva/sclera: Conjunctivae normal.     Pupils: Pupils are equal, round, and reactive to light.  Cardiovascular:     Rate and Rhythm: Normal rate and regular rhythm.     Heart sounds: No murmur heard. Pulmonary:     Effort: Pulmonary effort is normal. No respiratory distress.     Breath sounds: Normal breath sounds. No wheezing or  rales.  Musculoskeletal:     Cervical back: Normal range of motion and neck supple.  Skin:    General: Skin is warm and dry.     Capillary Refill: Capillary refill takes less than 2 seconds.  Neurological:     General: No focal deficit present.     Mental Status: She is alert and oriented to person, place, and time. Mental status is at baseline.  Psychiatric:        Mood and Affect: Mood normal.        Behavior: Behavior normal.        Thought Content: Thought content normal.        Judgment: Judgment normal.    Results for orders placed or performed in visit on 07/19/22  Cervicovaginal ancillary only  Result Value Ref Range   Bacterial Vaginitis (gardnerella) Negative    Candida Vaginitis Negative    Candida Glabrata Positive (A)    Comment      Normal Reference Range Bacterial Vaginosis - Negative   Comment Normal Reference Range Candida Species - Negative    Comment Normal  Reference Range Candida Galbrata - Negative       Assessment & Plan:   Problem List Items Addressed This Visit   None    Follow up plan: No follow-ups on file.

## 2022-07-27 ENCOUNTER — Ambulatory Visit: Payer: Self-pay | Admitting: Nurse Practitioner

## 2022-08-01 LAB — COMPREHENSIVE METABOLIC PANEL
ALT: 19 IU/L (ref 0–32)
AST: 20 IU/L (ref 0–40)
Albumin/Globulin Ratio: 1.6 (ref 1.2–2.2)
Albumin: 5 g/dL (ref 4.0–5.0)
Alkaline Phosphatase: 87 IU/L (ref 44–121)
BUN/Creatinine Ratio: 8 — ABNORMAL LOW (ref 9–23)
BUN: 8 mg/dL (ref 6–20)
Bilirubin Total: 0.6 mg/dL (ref 0.0–1.2)
CO2: 15 mmol/L — ABNORMAL LOW (ref 20–29)
Calcium: 10.1 mg/dL (ref 8.7–10.2)
Chloride: 99 mmol/L (ref 96–106)
Creatinine, Ser: 0.96 mg/dL (ref 0.57–1.00)
Globulin, Total: 3.2 g/dL (ref 1.5–4.5)
Glucose: 91 mg/dL (ref 70–99)
Potassium: 4.5 mmol/L (ref 3.5–5.2)
Sodium: 138 mmol/L (ref 134–144)
Total Protein: 8.2 g/dL (ref 6.0–8.5)
eGFR: 86 mL/min/{1.73_m2} (ref >59)

## 2022-08-01 LAB — LIPID PANEL
Chol/HDL Ratio: 4.7 ratio — ABNORMAL HIGH (ref 0.0–4.4)
Cholesterol, Total: 192 mg/dL (ref 100–199)
HDL: 41 mg/dL (ref >39)
LDL Chol Calc (NIH): 122 mg/dL — ABNORMAL HIGH (ref 0–99)
Triglycerides: 165 mg/dL — ABNORMAL HIGH (ref 0–149)
VLDL Cholesterol Cal: 29 mg/dL (ref 5–40)

## 2022-08-01 LAB — CBC WITH DIFFERENTIAL/PLATELET

## 2022-08-01 LAB — HEMOGLOBIN A1C

## 2022-08-13 ENCOUNTER — Other Ambulatory Visit: Payer: Self-pay | Admitting: Physician Assistant

## 2022-08-13 DIAGNOSIS — F32A Depression, unspecified: Secondary | ICD-10-CM

## 2022-08-15 NOTE — Telephone Encounter (Signed)
Requested Prescriptions  Pending Prescriptions Disp Refills   buPROPion (WELLBUTRIN XL) 150 MG 24 hr tablet [Pharmacy Med Name: BUPROPION XL 150MG  TABLETS (24 H)] 90 tablet 0    Sig: TAKE 1 TABLET(150 MG) BY MOUTH DAILY     Psychiatry: Antidepressants - bupropion Passed - 08/13/2022 10:02 AM      Passed - Cr in normal range and within 360 days    Creatinine, Ser  Date Value Ref Range Status  07/13/2022 0.96 0.57 - 1.00 mg/dL Final         Passed - AST in normal range and within 360 days    AST  Date Value Ref Range Status  07/13/2022 20 0 - 40 IU/L Final         Passed - ALT in normal range and within 360 days    ALT  Date Value Ref Range Status  07/13/2022 19 0 - 32 IU/L Final         Passed - Completed PHQ-2 or PHQ-9 in the last 360 days      Passed - Last BP in normal range    BP Readings from Last 1 Encounters:  07/19/22 114/60         Passed - Valid encounter within last 6 months    Recent Outpatient Visits           1 month ago Depression, major, single episode, moderate ([place])   [place], NP   3 months ago Anxiety and depression   [place] [facility] Mecum, Dani Gobble, PA-C   5 months ago Anxiety and depression   [place] [facility] Mecum, Dani Gobble, PA-C   1 year ago Morbid obesity ([place])   [place], Lauren A, NP   1 year ago Depression, major, single episode, moderate ([place])   [place] Mc[facility]we[facility], Scheryl Darter, NP

## 2022-09-05 NOTE — Progress Notes (Unsigned)
    GYNECOLOGY PROGRESS NOTE  Subjective:    Patient ID: Shelly Harris, female    DOB: 25-Sep-2000, 22 y.o.   MRN: [id number]  HPI  Patient is a 22 y.o. G0P0000 female who presents for 2 month follow up on irregular menses. She has been on Nextstellis for two months. Ultrasound was done on 07/14/2022  {Common ambulatory SmartLinks:19316}  Review of Systems {ros; complete:30496}   Objective:   There were no vitals taken for this visit. There is no height or weight on file to calculate BMI. General appearance: {general exam:16600} Abdomen: {abdominal exam:16834} Pelvic: {pelvic exam:16852::"cervix normal in appearance","external genitalia normal","no adnexal masses or tenderness","no cervical motion tenderness","rectovaginal septum normal","uterus normal size, shape, and consistency","vagina normal without discharge"} Extremities: {extremity exam:5109} Neurologic: {neuro exam:17854}  Ultrasound:  Narrative & Impression  CLINICAL DATA:  Pelvic pain, irregular menstrual cycle. The patient's last menstrual period was 06/26/2022.   EXAM: TRANSABDOMINAL AND TRANSVAGINAL ULTRASOUND OF PELVIS   TECHNIQUE: Both transabdominal and transvaginal ultrasound examinations of the pelvis were performed. Transabdominal technique was performed for global imaging of the pelvis including uterus, ovaries, adnexal regions, and pelvic cul-de-sac. It was necessary to proceed with endovaginal exam following the transabdominal exam to visualize the uterus and adnexa.   COMPARISON:  Pelvic ultrasound dated 05/27/2021.   FINDINGS: Uterus   Measurements: 8.2 x 3.9 x 4.6 cm = volume: 75 mL. No fibroids or other mass visualized.   Endometrium   Thickness: 10 mm.  No focal abnormality visualized.   Right ovary   Measurements: 3.5 x 2.4 x 2.5 cm = volume: 11 mL. Normal appearance/no adnexal mass.   Left ovary   Measurements: 3.3 x 2.3 x 2.1 cm = volume: 8 mL. Normal appearance/no adnexal  mass.   Other findings   No abnormal free fluid.   IMPRESSION: Normal pelvic ultrasound.    Assessment:   1. Irregular menstrual cycle   2. Pelvic pain      Plan:   There are no diagnoses linked to this encounter.    Hildred Laser, MD [facility]

## 2022-09-06 ENCOUNTER — Other Ambulatory Visit (HOSPITAL_COMMUNITY)
Admission: RE | Admit: 2022-09-06 | Discharge: 2022-09-06 | Disposition: A | Discharge status: Home / Self Care | Payer: BLUE CROSS/BLUE SHIELD | Source: Ambulatory Visit | Attending: Obstetrics and Gynecology | Admitting: Obstetrics and Gynecology

## 2022-09-06 ENCOUNTER — Ambulatory Visit (INDEPENDENT_AMBULATORY_CARE_PROVIDER_SITE_OTHER): Payer: BLUE CROSS/BLUE SHIELD | Admitting: Obstetrics and Gynecology

## 2022-09-06 ENCOUNTER — Encounter: Payer: Self-pay | Admitting: Obstetrics and Gynecology

## 2022-09-06 VITALS — BP 111/77 | HR 68 | Resp 16 | Ht 65.0 in | Wt 255.5 lb

## 2022-09-06 DIAGNOSIS — A601 Herpesviral infection of perianal skin and rectum: Secondary | ICD-10-CM | POA: Diagnosis not present

## 2022-09-06 DIAGNOSIS — N926 Irregular menstruation, unspecified: Secondary | ICD-10-CM

## 2022-09-06 DIAGNOSIS — Z113 Encounter for screening for infections with a predominantly sexual mode of transmission: Secondary | ICD-10-CM | POA: Diagnosis present

## 2022-09-06 DIAGNOSIS — R102 Pelvic and perineal pain: Secondary | ICD-10-CM | POA: Diagnosis present

## 2022-09-06 MED ORDER — VALACYCLOVIR HCL 1 G PO TABS: 1000.0000 mg | ORAL_TABLET | Freq: Two times a day (BID) | ORAL | 3 refills | Status: DC

## 2022-09-07 ENCOUNTER — Other Ambulatory Visit: Payer: Self-pay

## 2022-09-07 ENCOUNTER — Encounter: Payer: Self-pay | Admitting: Obstetrics and Gynecology

## 2022-09-07 DIAGNOSIS — B9689 Other specified bacterial agents as the cause of diseases classified elsewhere: Secondary | ICD-10-CM

## 2022-09-07 DIAGNOSIS — B3731 Acute candidiasis of vulva and vagina: Secondary | ICD-10-CM

## 2022-09-07 LAB — CERVICOVAGINAL ANCILLARY ONLY
Bacterial Vaginitis (gardnerella): POSITIVE — AB
Candida Glabrata: NEGATIVE
Candida Vaginitis: POSITIVE — AB
Chlamydia: NEGATIVE
Comment: NEGATIVE
Comment: NEGATIVE
Comment: NEGATIVE
Comment: NEGATIVE
Comment: NEGATIVE
Comment: NORMAL
Neisseria Gonorrhea: NEGATIVE
Trichomonas: NEGATIVE

## 2022-09-07 LAB — HEPATITIS C ANTIBODY: Hep C Virus Ab: NONREACTIVE

## 2022-09-07 LAB — RPR: RPR Ser Ql: NONREACTIVE

## 2022-09-07 LAB — HSV 1 AND 2 AB, IGG
HSV 1 Glycoprotein G Ab, IgG: <0.91 index (ref 0.00–0.90)
HSV 2 IgG, Type Spec: <0.91 index (ref 0.00–0.90)

## 2022-09-07 LAB — HIV ANTIBODY (ROUTINE TESTING W REFLEX): HIV Screen 4th Generation wRfx: NONREACTIVE

## 2022-09-07 MED ORDER — FLUCONAZOLE 150 MG PO TABS: 150.0000 mg | ORAL_TABLET | Freq: Once | ORAL | 0 refills | Status: AC

## 2022-09-07 MED ORDER — METRONIDAZOLE 500 MG PO TABS: 500.0000 mg | ORAL_TABLET | Freq: Two times a day (BID) | ORAL | 0 refills | Status: DC

## 2022-09-08 ENCOUNTER — Telehealth: Payer: Self-pay

## 2022-09-08 LAB — HERPES SIMPLEX VIRUS CULTURE

## 2022-09-08 NOTE — Telephone Encounter (Signed)
Received after hour nurse triage line patient states she missed a call from the office. I don't see a phone note in chart but do see a message where Jessilyn and another CMA we're chatting. Just called to see if everything was handled.

## 2022-10-15 ENCOUNTER — Other Ambulatory Visit: Payer: Self-pay | Admitting: Physician Assistant

## 2022-10-15 DIAGNOSIS — F419 Anxiety disorder, unspecified: Secondary | ICD-10-CM

## 2022-10-17 NOTE — Telephone Encounter (Signed)
Requested Prescriptions  Pending Prescriptions Disp Refills   citalopram (CELEXA) 20 MG tablet [Pharmacy Med Name: CITALOPRAM 20MG  TABLETS] 30 tablet 0    Sig: TAKE 1 TABLET(20 MG) BY MOUTH DAILY     Psychiatry:  Antidepressants - SSRI Passed - 10/15/2022  8:47 AM      Passed - Completed PHQ-2 or PHQ-9 in the last 360 days      Passed - Valid encounter within last 6 months    Recent Outpatient Visits           3 months ago Depression, major, single episode, moderate (HCC)   [place] [facility] Larae Grooms, NP   6 months ago Anxiety and depression   [place] [facility] Mecum, Oswaldo Conroy, PA-C   7 months ago Anxiety and depression   [place] [facility] Mecum, Oswaldo Conroy, PA-C   1 year ago Morbid obesity (HCC)   [place] [facility] McElwee, Lauren A, NP   2 years ago Depression, major, single episode, moderate (HCC)   [place] [facility], Jake Church, NP

## 2022-11-01 ENCOUNTER — Ambulatory Visit: Payer: BLUE CROSS/BLUE SHIELD | Admitting: Gastroenterology

## 2022-11-19 ENCOUNTER — Other Ambulatory Visit: Payer: Self-pay | Admitting: Nurse Practitioner

## 2022-11-19 DIAGNOSIS — F32A Depression, unspecified: Secondary | ICD-10-CM

## 2022-11-21 NOTE — Telephone Encounter (Signed)
Requested Prescriptions  Pending Prescriptions Disp Refills   citalopram (CELEXA) 20 MG tablet [Pharmacy Med Name: CITALOPRAM 20MG  TABLETS] 30 tablet 0    Sig: TAKE 1 TABLET(20 MG) BY MOUTH DAILY     Psychiatry:  Antidepressants - SSRI Passed - 11/19/2022  8:40 AM      Passed - Completed PHQ-2 or PHQ-9 in the last 360 days      Passed - Valid encounter within last 6 months    Recent Outpatient Visits           4 months ago Depression, major, single episode, moderate (HCC)   [place] [facility] Larae Grooms, NP   7 months ago Anxiety and depression   [place] [facility] Mecum, Oswaldo Conroy, PA-C   8 months ago Anxiety and depression   [place] [facility] Mecum, Oswaldo Conroy, PA-C   1 year ago Morbid obesity (HCC)   [place] [facility] McElwee, Lauren A, NP   2 years ago Depression, major, single episode, moderate (HCC)   [place] [facility], Jake Church, NP

## 2023-02-28 DIAGNOSIS — M7989 Other specified soft tissue disorders: Secondary | ICD-10-CM | POA: Insufficient documentation

## 2023-03-03 ENCOUNTER — Ambulatory Visit: Payer: BLUE CROSS/BLUE SHIELD | Admitting: Family Medicine

## 2023-03-31 ENCOUNTER — Ambulatory Visit: Payer: Medicaid Other | Admitting: Family Medicine

## 2023-04-06 ENCOUNTER — Ambulatory Visit
Admission: EM | Admit: 2023-04-06 | Discharge: 2023-04-06 | Disposition: A | Discharge status: Home / Self Care | Payer: Medicaid Other | Attending: Emergency Medicine | Admitting: Emergency Medicine

## 2023-04-06 ENCOUNTER — Encounter: Payer: Self-pay | Admitting: Emergency Medicine

## 2023-04-06 DIAGNOSIS — B9689 Other specified bacterial agents as the cause of diseases classified elsewhere: Secondary | ICD-10-CM | POA: Diagnosis not present

## 2023-04-06 DIAGNOSIS — N390 Urinary tract infection, site not specified: Secondary | ICD-10-CM | POA: Insufficient documentation

## 2023-04-06 DIAGNOSIS — N760 Acute vaginitis: Secondary | ICD-10-CM | POA: Insufficient documentation

## 2023-04-06 LAB — WET PREP, GENITAL
Sperm: NONE SEEN
Trich, Wet Prep: NONE SEEN
WBC, Wet Prep HPF POC: <10 — AB (ref <10)
Yeast Wet Prep HPF POC: NONE SEEN

## 2023-04-06 LAB — URINALYSIS, W/ REFLEX TO CULTURE (INFECTION SUSPECTED)
Bilirubin Urine: NEGATIVE
Glucose, UA: NEGATIVE mg/dL
Ketones, ur: NEGATIVE mg/dL
Nitrite: NEGATIVE
Protein, ur: NEGATIVE mg/dL
Specific Gravity, Urine: 1.02 (ref 1.005–1.030)
pH: 6.5 (ref 5.0–8.0)

## 2023-04-06 MED ORDER — METRONIDAZOLE 500 MG PO TABS: 500.0000 mg | ORAL_TABLET | Freq: Two times a day (BID) | ORAL | 0 refills | Status: DC

## 2023-04-06 MED ORDER — NITROFURANTOIN MONOHYD MACRO 100 MG PO CAPS: 100.0000 mg | ORAL_CAPSULE | Freq: Two times a day (BID) | ORAL | 0 refills | Status: DC

## 2023-04-06 MED ORDER — PHENAZOPYRIDINE HCL 200 MG PO TABS: 200.0000 mg | ORAL_TABLET | Freq: Three times a day (TID) | ORAL | 0 refills | Status: DC

## 2023-04-06 NOTE — ED Triage Notes (Signed)
Pt presents with dysuria and urgency x 2 weeks. Her symptoms became worse the past 3 days.

## 2023-04-06 NOTE — ED Provider Notes (Signed)
[facility]    CSN: [id number] Arrival date & time: 04/06/23  1853      History   Chief Complaint Chief Complaint  Patient presents with   Dysuria    HPI Shelly Harris is a 22 y.o. female.   HPI  22 year old female with past medical history significant for GERD, depression, anxiety presents for evaluation of 2 weeks with of urinary complaints which include burning with urination and urinary urgency.  She denies any fever, nausea, vomiting, blood in her urine, vaginal discharge, or vaginal itching.  Past Medical History:  Diagnosis Date   Anxiety    Chlamydia 02/2019   Depression    GERD (gastroesophageal reflux disease)    Orthodontics    Invisalign   RSV (respiratory syncytial virus infection)    as infant   Seasonal allergies    Stomach ulcer     Patient Active Problem List   Diagnosis Date Noted   Amenorrhea, unspecified 03/08/2022   Depression, major, single episode, moderate (HCC) 09/08/2020   Morbid obesity (HCC) 09/08/2020   Abnormal weight gain 07/04/2019   Anxiety and depression 03/25/2019    Past Surgical History:  Procedure Laterality Date   DENTAL REHABILITATION     age 69   TONSILLECTOMY AND ADENOIDECTOMY Bilateral 02/07/2017   Procedure: TONSILLECTOMY;  Surgeon: Geanie Logan, MD;  Location: [facility];  Service: ENT;  Laterality: Bilateral;    OB History     Gravida  0   Para  0   Term  0   Preterm  0   AB  0   Living  0      SAB  0   IAB  0   Ectopic  0   Multiple  0   Live Births  0            Home Medications    Prior to Admission medications   Medication Sig Start Date End Date Taking? Authorizing Provider  metroNIDAZOLE (FLAGYL) 500 MG tablet Take 1 tablet (500 mg total) by mouth 2 (two) times daily. 04/06/23  Yes Becky Augusta, NP  nitrofurantoin, macrocrystal-monohydrate, (MACROBID) 100 MG capsule Take 1 capsule (100 mg total) by mouth 2 (two) times daily. 04/06/23  Yes Becky Augusta,  NP  phenazopyridine (PYRIDIUM) 200 MG tablet Take 1 tablet (200 mg total) by mouth 3 (three) times daily. 04/06/23  Yes Becky Augusta, NP  buPROPion (WELLBUTRIN XL) 150 MG 24 hr tablet TAKE 1 TABLET(150 MG) BY MOUTH DAILY 08/15/22   Larae Grooms, NP  citalopram (CELEXA) 20 MG tablet TAKE 1 TABLET(20 MG) BY MOUTH DAILY 11/21/22   Larae Grooms, NP  hydrOXYzine (ATARAX) 25 MG tablet Take 25 mg by mouth daily.    [provider]  valACYclovir (VALTREX) 1000 MG tablet Take 1 tablet (1,000 mg total) by mouth 2 (two) times daily. Take for ten days. 09/06/22   Hildred Laser, MD    Family History Family History  Problem Relation Age of Onset   Kidney disease Mother    Cancer Father    Diabetes Maternal Grandmother    Breast cancer Neg Hx    Ovarian cancer Neg Hx     Social History Social History   Tobacco Use   Smoking status: Never   Smokeless tobacco: Never  Vaping Use   Vaping status: Every Day  Substance Use Topics   Alcohol use: Never   Drug use: Never     Allergies   Patient has no known allergies.  Review of Systems Review of Systems  Constitutional:  Negative for fever.  Gastrointestinal:  Negative for abdominal pain, nausea and vomiting.  Genitourinary:  Positive for dysuria and urgency. Negative for frequency, hematuria, vaginal discharge and vaginal pain.  Musculoskeletal:  Negative for back pain.     Physical Exam Triage Vital Signs ED Triage Vitals  Encounter Vitals Group     BP      Systolic BP Percentile      Diastolic BP Percentile      Pulse      Resp      Temp      Temp src      SpO2      Weight      Height      Head Circumference      Peak Flow      Pain Score      Pain Loc      Pain Education      Exclude from Growth Chart    No data found.  Updated Vital Signs BP (!) 146/76 (BP Location: Right Wrist)   Pulse 76   Temp 98.8 F (37.1 C) (Oral)   Resp 16   LMP  (LMP Unknown)   SpO2 98%   Visual Acuity Right Eye  Distance:   Left Eye Distance:   Bilateral Distance:    Right Eye Near:   Left Eye Near:    Bilateral Near:     Physical Exam Vitals and nursing note reviewed.  Constitutional:      Appearance: Normal appearance. She is not ill-appearing.  HENT:     Head: Normocephalic and atraumatic.  Cardiovascular:     Rate and Rhythm: Normal rate and regular rhythm.     Pulses: Normal pulses.     Heart sounds: Normal heart sounds. No murmur heard.    No friction rub. No gallop.  Pulmonary:     Effort: Pulmonary effort is normal.     Breath sounds: Normal breath sounds. No wheezing, rhonchi or rales.  Abdominal:     Tenderness: There is no right CVA tenderness or left CVA tenderness.  Skin:    General: Skin is warm and dry.     Capillary Refill: Capillary refill takes less than 2 seconds.     Findings: No rash.  Neurological:     General: No focal deficit present.     Mental Status: She is alert and oriented to person, place, and time.      UC Treatments / Results  Labs (all labs ordered are listed, but only abnormal results are displayed) Labs Reviewed  WET PREP, GENITAL - Abnormal; Notable for the following components:      Result Value   Clue Cells Wet Prep HPF POC PRESENT (*)    WBC, Wet Prep HPF POC <10 (*)    All other components within normal limits  URINALYSIS, W/ REFLEX TO CULTURE (INFECTION SUSPECTED) - Abnormal; Notable for the following components:   APPearance HAZY (*)    Hgb urine dipstick TRACE (*)    Leukocytes,Ua SMALL (*)    Bacteria, UA MANY (*)    All other components within normal limits    EKG   Radiology No results found.  Procedures Procedures (including critical care time)  Medications Ordered in UC Medications - No data to display  Initial Impression / Assessment and Plan / UC Course  I have reviewed the triage vital signs and the nursing notes.  Pertinent labs & imaging results that  were available during my care of the patient were  reviewed by me and considered in my medical decision making (see chart for details).   Patient is a nontoxic-appearing 22 year old female presenting for evaluation of 2 weeks worth of urinary symptoms as outlined HPI above.  She denies any vaginal discharge itching and reports that she has been experiencing burning with urination along with urinary urgency but no frequency or hematuria for the past 2 weeks.  On exam she has no CVA tenderness and her abdomen is soft and nontender.  I will order a vaginal wet prep to evaluate for the presence of BV or yeast as well as a urinalysis to evaluate for the presence of UTI.  Urinalysis shows hazy appearance with trace hemoglobin and small leukocyte esterase but is negative for nitrites or protein.  Reflex microscopy is contaminated with skin cells showing 11-20 but also 21-50 WBCs and many bacteria.  Urine will not reflex to culture.  Vaginal wet prep is positive for clue cells.  I will discharge patient home with a diagnosis of UTI and bacterial vaginosis and start her on metronidazole 500 mg twice daily for 7 days for treatment of the BV and Macrobid 100 mg twice daily for 5 days for treatment of UTI.  Also Pyridium 200 mg every 8 hours as needed for urinary discomfort.  Return precautions reviewed.   Final Clinical Impressions(s) / UC Diagnoses   Final diagnoses:  Lower urinary tract infectious disease  BV (bacterial vaginosis)     Discharge Instructions      Take the Flagyl (metronidazole) 500 mg twice daily for treatment of your bacterial vaginosis.  Avoid alcohol while on the metronidazole as taken together will cause of vomiting.  Bacterial vaginosis is often caused by a imbalance of bacteria in your vaginal vault.  This is sometimes a result of using tampons or hormonal fluctuations during her menstrual cycle.  You if your symptoms are recurrent you can try using a boric acid suppository twice weekly to help maintain the acid-base balance  in your vagina vault which could prevent further infection.  You can also try vaginal probiotics to help return normal bacterial balance.   Take the Macrobid twice daily for 5 days with food for treatment of urinary tract infection.  Use the Pyridium every 8 hours as needed for urinary discomfort.  This will turn your urine a bright red-orange.  Increase your oral fluid intake so that you increase your urine production and or flushing your urinary system.  Take an over-the-counter probiotic, such as Culturelle-Align-Activia, 1 hour after each dose of antibiotic to prevent diarrhea or yeast infections from forming.  We will culture urine and change the antibiotics if necessary.  Return for reevaluation, or see your primary care provider, for any new or worsening symptoms.      ED Prescriptions     Medication Sig Dispense Auth. Provider   nitrofurantoin, macrocrystal-monohydrate, (MACROBID) 100 MG capsule Take 1 capsule (100 mg total) by mouth 2 (two) times daily. 10 capsule Becky Augusta, NP   phenazopyridine (PYRIDIUM) 200 MG tablet Take 1 tablet (200 mg total) by mouth 3 (three) times daily. 6 tablet Becky Augusta, NP   metroNIDAZOLE (FLAGYL) 500 MG tablet Take 1 tablet (500 mg total) by mouth 2 (two) times daily. 14 tablet Becky Augusta, NP      PDMP not reviewed this encounter.   Becky Augusta, NP 04/06/23 2007

## 2023-04-06 NOTE — Discharge Instructions (Signed)
Take the Flagyl (metronidazole) 500 mg twice daily for treatment of your bacterial vaginosis.  Avoid alcohol while on the metronidazole as taken together will cause of vomiting.  Bacterial vaginosis is often caused by a imbalance of bacteria in your vaginal vault.  This is sometimes a result of using tampons or hormonal fluctuations during her menstrual cycle.  You if your symptoms are recurrent you can try using a boric acid suppository twice weekly to help maintain the acid-base balance in your vagina vault which could prevent further infection.  You can also try vaginal probiotics to help return normal bacterial balance.   Take the Macrobid twice daily for 5 days with food for treatment of urinary tract infection.  Use the Pyridium every 8 hours as needed for urinary discomfort.  This will turn your urine a bright red-orange.  Increase your oral fluid intake so that you increase your urine production and or flushing your urinary system.  Take an over-the-counter probiotic, such as Culturelle-Align-Activia, 1 hour after each dose of antibiotic to prevent diarrhea or yeast infections from forming.  We will culture urine and change the antibiotics if necessary.  Return for reevaluation, or see your primary care provider, for any new or worsening symptoms.

## 2023-06-15 ENCOUNTER — Ambulatory Visit
Admission: EM | Admit: 2023-06-15 | Discharge: 2023-06-15 | Disposition: A | Discharge status: Home / Self Care | Payer: Medicaid Other | Attending: Family Medicine | Admitting: Family Medicine

## 2023-06-15 DIAGNOSIS — B379 Candidiasis, unspecified: Secondary | ICD-10-CM | POA: Insufficient documentation

## 2023-06-15 DIAGNOSIS — N898 Other specified noninflammatory disorders of vagina: Secondary | ICD-10-CM | POA: Diagnosis not present

## 2023-06-15 DIAGNOSIS — B009 Herpesviral infection, unspecified: Secondary | ICD-10-CM | POA: Diagnosis not present

## 2023-06-15 LAB — URINALYSIS, W/ REFLEX TO CULTURE (INFECTION SUSPECTED)
Bilirubin Urine: NEGATIVE
Glucose, UA: NEGATIVE mg/dL
Hgb urine dipstick: NEGATIVE
Ketones, ur: NEGATIVE mg/dL
Nitrite: NEGATIVE
Specific Gravity, Urine: 1.02 (ref 1.005–1.030)
pH: 8.5 — ABNORMAL HIGH (ref 5.0–8.0)

## 2023-06-15 LAB — WET PREP, GENITAL
Clue Cells Wet Prep HPF POC: NONE SEEN
Sperm: NONE SEEN
Trich, Wet Prep: NONE SEEN
WBC, Wet Prep HPF POC: >10 — AB (ref <10)
Yeast Wet Prep HPF POC: NONE SEEN

## 2023-06-15 MED ORDER — VALACYCLOVIR HCL 1 G PO TABS: 1000.0000 mg | ORAL_TABLET | Freq: Three times a day (TID) | ORAL | 0 refills | Status: AC

## 2023-06-15 MED ORDER — FLUCONAZOLE 150 MG PO TABS: 150.0000 mg | ORAL_TABLET | ORAL | 1 refills | Status: AC

## 2023-06-15 NOTE — Discharge Instructions (Signed)
Stop by the pharmacy to pick up your prescriptions.  Follow up with your primary care provider as needed.  

## 2023-06-15 NOTE — ED Triage Notes (Signed)
Pt c/o vaginal itching & burning x3 days. Hx of HSV & yeast infections. Denies any concerns for STD. Req refill on valacyclovir.

## 2023-06-15 NOTE — ED Provider Notes (Signed)
[facility]    CSN: [id number] Arrival date & time: 06/15/23  1508      History   Chief Complaint Chief Complaint  Patient presents with   Vaginal Itching   Medication Refill     HPI HPI KARENZA ELBON is a 23 y.o. female.    Faye Ramsay Gangi presents for vaginal irritation. She has HSV but hasn't had an outbreak in a year. No rash or lesions. No new partners. She and her partner had intercourse last night and she had vaginal whole swelling. Has burning but not like with urination.  Has history of recurrent yeast infection.  Has  not had any antibiotics in last 30 days.   Denies known STI exposure.  [place] does not use condoms regularly. She is  not currently pregnant.  Patient's last menstrual period was 05/22/2023 (approximate).         Past Medical History:  Diagnosis Date   Anxiety    Chlamydia 02/2019   Depression    GERD (gastroesophageal reflux disease)    Orthodontics    Invisalign   RSV (respiratory syncytial virus infection)    as infant   Seasonal allergies    Stomach ulcer     Patient Active Problem List   Diagnosis Date Noted   Amenorrhea, unspecified 03/08/2022   Depression, major, single episode, moderate (HCC) 09/08/2020   Morbid obesity (HCC) 09/08/2020   Abnormal weight gain 07/04/2019   Anxiety and depression 03/25/2019    Past Surgical History:  Procedure Laterality Date   DENTAL REHABILITATION     age 83   TONSILLECTOMY AND ADENOIDECTOMY Bilateral 02/07/2017   Procedure: TONSILLECTOMY;  Surgeon: Geanie Logan, MD;  Location: [facility];  Service: ENT;  Laterality: Bilateral;    OB History     Gravida  0   Para  0   Term  0   Preterm  0   AB  0   Living  0      SAB  0   IAB  0   Ectopic  0   Multiple  0   Live Births  0            Home Medications    Prior to Admission medications   Medication Sig Start Date End Date Taking? Authorizing Provider  buPROPion (WELLBUTRIN XL) 150 MG  24 hr tablet TAKE 1 TABLET(150 MG) BY MOUTH DAILY 08/15/22  Yes Larae Grooms, NP  citalopram (CELEXA) 20 MG tablet TAKE 1 TABLET(20 MG) BY MOUTH DAILY 11/21/22  Yes Larae Grooms, NP  fluconazole (DIFLUCAN) 150 MG tablet Take 1 tablet (150 mg total) by mouth every 3 (three) days for 2 doses. 06/15/23 06/19/23 Yes Fable Huisman, DO  hydrOXYzine (ATARAX) 25 MG tablet Take 25 mg by mouth daily.   Yes [provider]  valACYclovir (VALTREX) 1000 MG tablet Take 1 tablet (1,000 mg total) by mouth 3 (three) times daily for 5 days. 06/15/23 06/20/23 Yes Katha Cabal, DO    Family History Family History  Problem Relation Age of Onset   Kidney disease Mother    Cancer Father    Diabetes Maternal Grandmother    Breast cancer Neg Hx    Ovarian cancer Neg Hx     Social History Social History   Tobacco Use   Smoking status: Never   Smokeless tobacco: Never  Vaping Use   Vaping status: Every Day  Substance Use Topics   Alcohol use: Never   Drug use: Never  Allergies   Patient has no known allergies.   Review of Systems Review of Systems: :negative unless otherwise stated in HPI.      Physical Exam Triage Vital Signs ED Triage Vitals  Encounter Vitals Group     BP 06/15/23 1515 128/76     Systolic BP Percentile --      Diastolic BP Percentile --      Pulse Rate 06/15/23 1515 80     Resp 06/15/23 1515 16     Temp 06/15/23 1515 99.4 F (37.4 C)     Temp Source 06/15/23 1515 Oral     SpO2 06/15/23 1515 98 %     Weight 06/15/23 1515 210 lb (95.3 kg)     Height 06/15/23 1515 5\' 4"  (1.626 m)     Head Circumference --      Peak Flow --      Pain Score 06/15/23 1518 0     Pain Loc --      Pain Education --      Exclude from Growth Chart --    No data found.  Updated Vital Signs BP 128/76 (BP Location: Left Arm)   Pulse 80   Temp 99.4 F (37.4 C) (Oral)   Resp 16   Ht 5\' 4"  (1.626 m)   Wt 95.3 kg   LMP 05/22/2023 (Approximate)   SpO2 98%   BMI  36.05 kg/m   Visual Acuity Right Eye Distance:   Left Eye Distance:   Bilateral Distance:    Right Eye Near:   Left Eye Near:    Bilateral Near:     Physical Exam GEN: well appearing female in no acute distress  CVS: well perfused  RESP: speaking in full sentences without pause GU: deferred, patient performed self swab     UC Treatments / Results  Labs (all labs ordered are listed, but only abnormal results are displayed) Labs Reviewed  WET PREP, GENITAL - Abnormal; Notable for the following components:      Result Value   WBC, Wet Prep HPF POC >10 (*)    All other components within normal limits  URINALYSIS, W/ REFLEX TO CULTURE (INFECTION SUSPECTED) - Abnormal; Notable for the following components:   pH 8.5 (*)    Protein, ur TRACE (*)    Leukocytes,Ua TRACE (*)    Bacteria, UA MANY (*)    All other components within normal limits  URINE CULTURE    EKG   Radiology No results found.  Procedures Procedures (including critical care time)  Medications Ordered in UC Medications - No data to display  Initial Impression / Assessment and Plan / UC Course  I have reviewed the triage vital signs and the nursing notes.  Pertinent labs & imaging results that were available during my care of the patient were reviewed by me and considered in my medical decision making (see chart for details).      Patient is a 23 y.o.Marland Kitchen female  who presents for vaginal irritation.  Overall patient is well-appearing and afebrile.  Vital signs stable.  UA showing bacteruria with trace leukocyte esterase. Defer antibiotics for now. Urine culture obtained.  Follow-up sensitivities and start antibiotics, if needed.  Wet prep showing evidence of surprising showing no yeast vaginitis, bacterial vaginitis or trichomonas.  Surprisingly, her urinalysis showing yeast with hyphae.  Treat with Diflucan for 2 doses with a refill of 2 doses in the event that she has candidal cystitis.  Her elevated pH  goes against this  the urethra.  She has history of HSV: Refilled Valtrex as below  Return precautions including abdominal pain, fever, chills, nausea, or vomiting given. Discussed MDM, treatment plan and plan for follow-up with patient who agrees with plan.    Final Clinical Impressions(s) / UC Diagnoses   Final diagnoses:  HSV-1 (herpes simplex virus 1) infection  Yeast infection  Vaginal irritation     Discharge Instructions      Stop by the pharmacy to pick up your prescriptions.  Follow up with your primary care provider as needed.      ED Prescriptions     Medication Sig Dispense Auth. Provider   valACYclovir (VALTREX) 1000 MG tablet Take 1 tablet (1,000 mg total) by mouth 3 (three) times daily for 5 days. 15 tablet Dene Landsberg, DO   fluconazole (DIFLUCAN) 150 MG tablet Take 1 tablet (150 mg total) by mouth every 3 (three) days for 2 doses. 2 tablet Katha Cabal, DO      PDMP not reviewed this encounter.   Katha Cabal, DO 06/15/23 1556

## 2023-06-16 LAB — URINE CULTURE: Culture: 50000 — AB

## 2023-12-18 ENCOUNTER — Ambulatory Visit (INDEPENDENT_AMBULATORY_CARE_PROVIDER_SITE_OTHER): Admitting: Family Medicine

## 2023-12-18 VITALS — BP 160/99 | HR 80 | Resp 16 | Ht 64.0 in | Wt 325.4 lb

## 2023-12-18 DIAGNOSIS — F411 Generalized anxiety disorder: Secondary | ICD-10-CM

## 2023-12-18 DIAGNOSIS — R635 Abnormal weight gain: Secondary | ICD-10-CM

## 2023-12-18 DIAGNOSIS — R638 Other symptoms and signs concerning food and fluid intake: Secondary | ICD-10-CM | POA: Insufficient documentation

## 2023-12-18 DIAGNOSIS — R030 Elevated blood-pressure reading, without diagnosis of hypertension: Secondary | ICD-10-CM

## 2023-12-18 DIAGNOSIS — F331 Major depressive disorder, recurrent, moderate: Secondary | ICD-10-CM | POA: Diagnosis not present

## 2023-12-18 DIAGNOSIS — Z7689 Persons encountering health services in other specified circumstances: Secondary | ICD-10-CM

## 2023-12-18 MED ORDER — CITALOPRAM HYDROBROMIDE 20 MG PO TABS: 20.0000 mg | ORAL_TABLET | Freq: Every day | ORAL | 1 refills | Status: DC

## 2023-12-18 MED ORDER — BUPROPION HCL ER (XL) 150 MG PO TB24: 150.0000 mg | ORAL_TABLET | Freq: Every day | ORAL | 0 refills | Status: DC

## 2023-12-18 NOTE — Patient Instructions (Signed)
 Below are some facilities that offer counseling services.  Please call to arrange an appointment with a counselor:                                                                    Behavioral Health/Counseling Services:  [facility] Phone: [phone] Various locations- [place]  _______________________________________________________________________________________   [place] can also visit [email or web address] to find a counselor that is within your insurance's network.  Website: [email or web address] [email or web address]   If you need immediate emotional support, reach out to the national mental health hotline: 988. Whether you're facing mental health struggles, emotional distress, alcohol or drug use concerns, or just need someone to talk to, our caring counselors are here for you. You are not alone.    MyChart:  For all urgent or time sensitive needs we ask that you please call the office to avoid delays. Our number is [phone]) [phone]. MyChart is not constantly monitored and due to the large volume of messages a day, replies may take up to 72 business hours.   MyChart Policy: MyChart allows for you to see your visit notes, after visit summary, provider recommendations, lab and tests results, make an appointment, request refills, and contact your provider or the office for non-urgent questions or concerns. Providers are seeing patients during normal business hours and do not have built in time to review MyChart messages.  We ask that you allow a minimum of 3 business days for responses to [employer]. For this reason, please do not send urgent requests through MyChart. Please call the office at [phone]. New and ongoing conditions may require a visit. We have virtual and in person visit available for your convenience.  Complex MyChart concerns may require a visit. Your provider may request you schedule a virtual or in person visit to ensure we are providing  the best care possible. MyChart messages sent after 11:00 AM on Friday will not be received by the provider until Monday morning.    Lab and Test Results: You will receive your lab and test results on MyChart as soon as they are completed and results have been sent by the lab or testing facility. Due to this service, you will receive your results BEFORE your provider.  I review lab and tests results each morning prior to seeing patients. Some results require collaboration with other providers to ensure you are receiving the most appropriate care. For this reason, we ask that you please allow a minimum of 3-5 business days from the time the ALL results have been received for your provider to receive and review lab and test results and contact you about these.  Most lab and test result comments from the provider will be sent through MyChart. Your provider may recommend changes to the plan of care, follow-up visits, repeat testing, ask questions, or request an office visit to discuss these results. You may reply directly to this message or call the office at [phone] to provide information for the provider or set up an appointment. In some instances, you will be called with test results and recommendations. Please let us  know if this is preferred and we will make note of this in your chart to provide this for you.    If you have not heard  a response to your lab or test results in 5 business days from all results returning to MyChart, please call the office to let us  know. We ask that you please avoid calling prior to this time unless there is an emergent concern. Due to high call volumes, this can delay the resulting process.   After Hours: For all non-emergency after hours needs, please call the office at [phone] and select the option to reach the on-call provider service. On-call services are shared between multiple [facility] offices and therefore it will not be possible to speak directly with  your provider. On-call providers may provide medical advice and recommendations, but are unable to provide refills for maintenance medications.  For all emergency or urgent medical needs after normal business hours, we recommend that you seek care at the closest Urgent Care or Emergency Department to ensure appropriate treatment in a timely manner.  [facility] [place] has a 24 hour emergency room located on the ground floor for your convenience.    Urgent Concerns During the Business Day Providers are seeing patients from 8AM to 5PM, Monday through Thursday, and 8AM to 12PM on Friday with a busy schedule and are most often not able to respond to non-urgent calls until the end of the day or the next business day. If you should have URGENT concerns during the day, please call and speak to the nurse or schedule a same day appointment so that we can address your concern without delay.    Thank you, again, for choosing me as your health care partner. I appreciate your trust and look forward to learning more about you.    Evalene Arts, FNP-C

## 2023-12-18 NOTE — Progress Notes (Addendum)
 New Patient Office Visit  Subjective   Patient ID: Shelly Harris, female    DOB: 04-24-2001  Age: 23 y.o. MRN: [id number]  CC:  Chief Complaint  Patient presents with   Establish Care    Legs swelling x 6 months since increased - weight management- Family Hx of Kidney Disease. Genetic Antidepressant testing requested.    HPI Shelly Harris is a 23 year old female who presents to establish with [facility] at [facility].   CC: Patient here to establish care  Last PCP: Shelly Petty, NP at [facility]  Specialists: OBGYN  PMHx: anxiety/depression, GERD, seasonal allergies   WEIGHT: I have always struggled with weight. Over the past year, I have gained significant weight.  She was in an unhealthy relationship- felt like coping with food and now she feels like its a learned behavior.  However, she has been overweight throughout her life but she has never been this weight. Has noticed lower leg swelling over the past 6 months or so.   MOOD: Celexa  20mg  daily & Wellbutrin  150mg  XL The medications are fine, but I feel like it could be better.  She has been on these for a while. Would be interested in therapy.  She is also interested in GeneSight today.      12/18/2023    9:51 AM 07/13/2022   10:07 AM 04/18/2022    2:12 PM 03/07/2022    1:19 PM  GAD 7 : Generalized Anxiety Score  Nervous, Anxious, on Edge 2 3 0 3  Control/stop worrying 1 3 0 3  Worry too much - different things 2 3 0 3  Trouble relaxing 1 3 0 3  Restless 0 3 0 2  Easily annoyed or irritable 1 3 0 2  Afraid - awful might happen 2 3 0 3  Total GAD 7 Score 9 21 0 19  Anxiety Difficulty Somewhat difficult Extremely difficult Not difficult at all Extremely difficult       12/18/2023    9:50 AM 07/13/2022   10:06 AM 04/18/2022    2:12 PM  PHQ9 SCORE ONLY  PHQ-9 Total Score 9 24 0     Outpatient Encounter Medications as of 12/18/2023  Medication Sig    [DISCONTINUED] buPROPion  (WELLBUTRIN  XL) 150 MG 24 hr tablet TAKE 1 TABLET(150 MG) BY MOUTH DAILY   [DISCONTINUED] citalopram  (CELEXA ) 20 MG tablet TAKE 1 TABLET(20 MG) BY MOUTH DAILY   [START ON 03/19/2024] buPROPion  (WELLBUTRIN  XL) 150 MG 24 hr tablet Take 1 tablet (150 mg total) by mouth daily.   [START ON 03/19/2024] citalopram  (CELEXA ) 20 MG tablet Take 1 tablet (20 mg total) by mouth daily.   [DISCONTINUED] hydrOXYzine  (ATARAX ) 25 MG tablet Take 25 mg by mouth daily. (Patient not taking: Reported on 12/18/2023)   No facility-administered encounter medications on file as of 12/18/2023.    Patient Active Problem List   Diagnosis Date Noted   Unable to lose weight 12/18/2023   Leg swelling 02/28/2023   Digestive disorder 06/26/2022   Amenorrhea, unspecified 03/08/2022   Depression, major, single episode, moderate (HCC) 09/08/2020   Morbid obesity (HCC) 09/08/2020   Abnormal weight gain 07/04/2019   Anxiety and depression 03/25/2019   Past Medical History:  Diagnosis Date   Anxiety    Chlamydia 02/2019   Depression    GERD (gastroesophageal reflux disease)    Orthodontics    Invisalign   RSV (respiratory syncytial virus infection)    as infant  Seasonal allergies    Stomach ulcer    Past Surgical History:  Procedure Laterality Date   DENTAL REHABILITATION     age 40   TONSILLECTOMY AND ADENOIDECTOMY Bilateral 02/07/2017   Procedure: TONSILLECTOMY;  Surgeon: Blair Mt, MD;  Location: [facility];  Service: ENT;  Laterality: Bilateral;   Family History  Problem Relation Age of Onset   Kidney disease Mother    Irregular heart beat Mother    Cancer Father        Pituitary gland tumor   Diabetes Maternal Grandmother    Diabetes Paternal Grandmother    Cancer Paternal Great-grandmother        breast   Cancer Maternal Great-grandmother        Breast   Breast cancer Neg Hx    Ovarian cancer Neg Hx    Social History   Socioeconomic History   Marital  status: Single    Spouse name: Not on file   Number of children: Not on file   Years of education: Not on file   Highest education level: Some college, no degree  Occupational History   Not on file  Tobacco Use   Smoking status: Never    Passive exposure: Never   Smokeless tobacco: Never  Vaping Use   Vaping status: Every Day  Substance and Sexual Activity   Alcohol use: Never   Drug use: Never   Sexual activity: Yes    Birth control/protection: Condom  Other Topics Concern   Not on file  Social History Narrative   Not on file   Social Drivers of Health   Financial Resource Strain: Low Risk  (12/18/2023)   Overall Financial Resource Strain (CARDIA)    Difficulty of Paying Living Expenses: Not very hard  Food Insecurity: No Food Insecurity (12/18/2023)   Hunger Vital Sign    Worried About Running Out of Food in the Last Year: Never true    Ran Out of Food in the Last Year: Never true  Transportation Needs: No Transportation Needs (12/18/2023)   PRAPARE - Administrator, Civil Service (Medical): No    Lack of Transportation (Non-Medical): No  Physical Activity: Insufficiently Active (12/18/2023)   Exercise Vital Sign    Days of Exercise per Week: 2 days    Minutes of Exercise per Session: 20 min  Stress: Stress Concern Present (12/18/2023)   [employer] of Occupational Health - Occupational Stress Questionnaire    Feeling of Stress: To some extent  Social Connections: Moderately Isolated (12/18/2023)   Social Connection and Isolation Panel    Frequency of Communication with Friends and Family: More than three times a week    Frequency of Social Gatherings with Friends and Family: More than three times a week    Attends Religious Services: 1 to 4 times per year    Active Member of [employer] or Organizations: No    Attends Engineer, structural: Not on file    Marital Status: Never married  Catering manager Violence: Not on file   Outpatient Medications  Prior to Visit  Medication Sig Dispense Refill   buPROPion  (WELLBUTRIN  XL) 150 MG 24 hr tablet TAKE 1 TABLET(150 MG) BY MOUTH DAILY 90 tablet 0   citalopram  (CELEXA ) 20 MG tablet TAKE 1 TABLET(20 MG) BY MOUTH DAILY 90 tablet 0   hydrOXYzine  (ATARAX ) 25 MG tablet Take 25 mg by mouth daily. (Patient not taking: Reported on 12/18/2023)     No facility-administered medications prior to visit.  No Known Allergies  ROS: see HPI     Objective  Today's Vitals   12/18/23 0933 12/18/23 1042  BP: (!) 140/105 (!) 160/105  Pulse: 80   Resp: 16   SpO2: 98%   Weight: (!) 325 lb 6.4 oz (147.6 kg)   Height: 5' 4 (1.626 m)   PainSc: 0-No pain    Physical Exam Vitals reviewed.  Constitutional:      Appearance: Normal appearance. She is obese.  Cardiovascular:     Rate and Rhythm: Normal rate and regular rhythm.     Pulses: Normal pulses.     Heart sounds: Normal heart sounds.  Pulmonary:     Effort: Pulmonary effort is normal.     Breath sounds: Normal breath sounds.  Musculoskeletal:     Right lower leg: Edema (1+ pitting) present.     Left lower leg: Edema (1+ pitting) present.  Neurological:     Mental Status: She is alert.  Psychiatric:        Mood and Affect: Mood normal.        Behavior: Behavior normal.    Assessment & Plan:   1. Encounter to establish care (Primary) Patient is a 55- year-old female who presents today to establish care with primary care at [facility]. Reviewed the past medical history, family history, social history, surgical history, medications and allergies today- updates made as indicated. Patient has concerns today about her mood and abnormal weight gain.    2. Abnormal weight gain Patient presents today with concerns with significant weight gain in the past 6 months. She reports that she was previously in an unhealthy relationship and reports that she did cope with poor eating habits. She thinks is what caused her weight gain; however, she is concerned  because she has been noticing lower leg swelling. Denies chest pain, shortness of breath, unilateral swelling, erythema or warmth of lower extremities. Elevated blood pressure today. Physical exam is benign. Will obtain baseline labs. Discussed benefits of GLP-1 and patient would be interested if there is no underlying cause to her abnormal weight gain.  - CBC with Differential/Platelet - Comprehensive metabolic panel with GFR - Hemoglobin A1c - Lipid panel - TSH Rfx on Abnormal to Free T4  3. MDD (major depressive disorder), recurrent episode, moderate (HCC) PHQ9 completed with score of 9. Denies active or passive suicidal ideations.  Discussed benefits of cognitive behavioral therapy (CBT) and first-line pharmacotherapy concurrently. Patient is interested in counseling services. Referral placed to [facility]. GeneSight swab completed in office today.  - citalopram  (CELEXA ) 20 MG tablet; Take 1 tablet (20 mg total) by mouth daily.  Dispense: 90 tablet; Refill: 1 - buPROPion  (WELLBUTRIN  XL) 150 MG 24 hr tablet; Take 1 tablet (150 mg total) by mouth daily.  Dispense: 90 tablet; Refill: 0 - Ambulatory referral to Psychology  4. Generalized anxiety disorder GAD7 completed with score of 9. Denies issues with panic attacks, shortness of breath, difficulty breathing, palpitations, hyperventilation, and dizziness. Discussed benefits of cognitive behavioral therapy (CBT) and first-line pharmacotherapy concurrently. Patient is interested in counseling and would also like to continue with current medication regimen at this time. Plan for  4-6 week follow-up. Rx sent to pharmacy on file. GeneSight swab completed in office today.  - citalopram  (CELEXA ) 20 MG tablet; Take 1 tablet (20 mg total) by mouth daily.  Dispense: 90 tablet; Refill: 1 - Ambulatory referral to Psychology  5. Elevated blood pressure reading in office without diagnosis of hypertension Patient presents today with elevated  blood pressure, repeat  blood pressure still elevated. Patient in no acute distress and is well-appearing. Denies chest pain, shortness of breath, lower extremity edema, vision changes, headaches. Cardiovascular exam with heart regular rate and rhythm. Normal heart sounds, no murmurs present. No lower extremity edema present. Lungs clear to auscultation bilaterally. Advised patient to closely monitor blood pressure and return to office sooner if blood pressure begins to increase greater than 130/80. Follow-up in 4-6 weeks.      Return in about 5 weeks (around 01/22/2024) for weight management .   Evalene Arts, FNP

## 2023-12-19 ENCOUNTER — Encounter: Payer: Self-pay | Admitting: Family Medicine

## 2023-12-19 LAB — CBC WITH DIFFERENTIAL/PLATELET
Basophils Absolute: 0.1 x10E3/uL (ref 0.0–0.2)
Basos: 1 %
EOS (ABSOLUTE): 0.2 x10E3/uL (ref 0.0–0.4)
Eos: 2 %
Hematocrit: 44.8 % (ref 34.0–46.6)
Hemoglobin: 14.8 g/dL (ref 11.1–15.9)
Immature Grans (Abs): 0.1 x10E3/uL (ref 0.0–0.1)
Immature Granulocytes: 1 %
Lymphocytes Absolute: 2.7 x10E3/uL (ref 0.7–3.1)
Lymphs: 31 %
MCH: 30.2 pg (ref 26.6–33.0)
MCHC: 33 g/dL (ref 31.5–35.7)
MCV: 91 fL (ref 79–97)
Monocytes Absolute: 0.6 x10E3/uL (ref 0.1–0.9)
Monocytes: 6 %
Neutrophils Absolute: 5.2 x10E3/uL (ref 1.4–7.0)
Neutrophils: 59 %
Platelets: 291 x10E3/uL (ref 150–450)
RBC: 4.9 x10E6/uL (ref 3.77–5.28)
RDW: 12.3 % (ref 11.7–15.4)
WBC: 8.9 x10E3/uL (ref 3.4–10.8)

## 2023-12-19 LAB — COMPREHENSIVE METABOLIC PANEL WITH GFR
ALT: 28 IU/L (ref 0–32)
AST: 24 IU/L (ref 0–40)
Albumin: 4.2 g/dL (ref 4.0–5.0)
Alkaline Phosphatase: 78 IU/L (ref 44–121)
BUN/Creatinine Ratio: 10 (ref 9–23)
BUN: 6 mg/dL (ref 6–20)
Bilirubin Total: 0.3 mg/dL (ref 0.0–1.2)
CO2: 20 mmol/L (ref 20–29)
Calcium: 9.1 mg/dL (ref 8.7–10.2)
Chloride: 100 mmol/L (ref 96–106)
Creatinine, Ser: 0.61 mg/dL (ref 0.57–1.00)
Globulin, Total: 2.3 g/dL (ref 1.5–4.5)
Glucose: 93 mg/dL (ref 70–99)
Potassium: 4.3 mmol/L (ref 3.5–5.2)
Sodium: 136 mmol/L (ref 134–144)
Total Protein: 6.5 g/dL (ref 6.0–8.5)
eGFR: 129 mL/min/1.73 (ref >59)

## 2023-12-19 LAB — LIPID PANEL
Chol/HDL Ratio: 4.7 ratio — ABNORMAL HIGH (ref 0.0–4.4)
Cholesterol, Total: 210 mg/dL — ABNORMAL HIGH (ref 100–199)
HDL: 45 mg/dL (ref >39)
LDL Chol Calc (NIH): 111 mg/dL — ABNORMAL HIGH (ref 0–99)
Triglycerides: 314 mg/dL — ABNORMAL HIGH (ref 0–149)
VLDL Cholesterol Cal: 54 mg/dL — ABNORMAL HIGH (ref 5–40)

## 2023-12-19 LAB — HEMOGLOBIN A1C
Est. average glucose Bld gHb Est-mCnc: 111 mg/dL
Hgb A1c MFr Bld: 5.5 % (ref 4.8–5.6)

## 2023-12-19 LAB — TSH RFX ON ABNORMAL TO FREE T4: TSH: 3.74 u[IU]/mL (ref 0.450–4.500)

## 2023-12-22 ENCOUNTER — Ambulatory Visit: Payer: Self-pay | Admitting: Family Medicine

## 2023-12-25 ENCOUNTER — Other Ambulatory Visit: Payer: Self-pay | Admitting: Medical Genetics

## 2023-12-27 ENCOUNTER — Encounter: Payer: Self-pay | Admitting: Family Medicine

## 2024-01-14 ENCOUNTER — Ambulatory Visit

## 2024-01-22 ENCOUNTER — Ambulatory Visit: Admitting: Family Medicine

## 2024-01-30 ENCOUNTER — Ambulatory Visit: Admitting: Clinical

## 2024-01-30 ENCOUNTER — Ambulatory Visit (INDEPENDENT_AMBULATORY_CARE_PROVIDER_SITE_OTHER): Admitting: Family Medicine

## 2024-01-30 ENCOUNTER — Encounter: Payer: Self-pay | Admitting: Family Medicine

## 2024-01-30 DIAGNOSIS — E7800 Pure hypercholesterolemia, unspecified: Secondary | ICD-10-CM | POA: Diagnosis not present

## 2024-01-30 DIAGNOSIS — F32A Depression, unspecified: Secondary | ICD-10-CM

## 2024-01-30 DIAGNOSIS — F419 Anxiety disorder, unspecified: Secondary | ICD-10-CM

## 2024-01-30 MED ORDER — ZEPBOUND 2.5 MG/0.5ML ~~LOC~~ SOAJ: 2.5000 mg | SUBCUTANEOUS | 1 refills | Status: AC

## 2024-01-30 MED ORDER — ONDANSETRON 4 MG PO TBDP: 4.0000 mg | ORAL_TABLET | Freq: Three times a day (TID) | ORAL | 0 refills | Status: AC | PRN

## 2024-01-30 NOTE — Patient Instructions (Signed)
 MyChart:  For all urgent or time sensitive needs we ask that you please call the office to avoid delays. Our number is [phone]. MyChart is not constantly monitored and due to the large volume of messages a day, replies may take up to 72 business hours.   MyChart Policy: MyChart allows for you to see your visit notes, after visit summary, provider recommendations, lab and tests results, make an appointment, request refills, and contact your provider or the office for non-urgent questions or concerns. Providers are seeing patients during normal business hours and do not have built in time to review MyChart messages.  We ask that you allow a minimum of 3 business days for responses to [employer]. For this reason, please do not send urgent requests through MyChart. Please call the office at [phone]. New and ongoing conditions may require a visit. We have virtual and in person visit available for your convenience.  Complex MyChart concerns may require a visit. Your provider may request you schedule a virtual or in person visit to ensure we are providing the best care possible. MyChart messages sent after 11:00 AM on Friday will not be received by the provider until Monday morning.    Lab and Test Results: You will receive your lab and test results on MyChart as soon as they are completed and results have been sent by the lab or testing facility. Due to this service, you will receive your results BEFORE your provider.  I review lab and tests results each morning prior to seeing patients. Some results require collaboration with other providers to ensure you are receiving the most appropriate care. For this reason, we ask that you please allow a minimum of 3-5 business days from the time the ALL results have been received for your provider to receive and review lab and test results and contact you about these.  Most lab and test result comments from the provider will be sent through MyChart.  Your provider may recommend changes to the plan of care, follow-up visits, repeat testing, ask questions, or request an office visit to discuss these results. You may reply directly to this message or call the office at [phone] to provide information for the provider or set up an appointment. In some instances, you will be called with test results and recommendations. Please let us  know if this is preferred and we will make note of this in your chart to provide this for you.    If you have not heard a response to your lab or test results in 5 business days from all results returning to MyChart, please call the office to let us  know. We ask that you please avoid calling prior to this time unless there is an emergent concern. Due to high call volumes, this can delay the resulting process.   After Hours: For all non-emergency after hours needs, please call the office at [phone] and select the option to reach the on-call provider service. On-call services are shared between multiple [place] offices and therefore it will not be possible to speak directly with your provider. On-call providers may provide medical advice and recommendations, but are unable to provide refills for maintenance medications.  For all emergency or urgent medical needs after normal business hours, we recommend that you seek care at the closest Urgent Care or Emergency Department to ensure appropriate treatment in a timely manner.  [facility] [place] has a 24 hour emergency room located on the ground floor for your  convenience.    Urgent Concerns During the Business Day Providers are seeing patients from 8AM to 5PM, Monday through Thursday, and 8AM to 12PM on Friday with a busy schedule and are most often not able to respond to non-urgent calls until the end of the day or the next business day. If you should have URGENT concerns during the day, please call and speak to the nurse or schedule a same day  appointment so that we can address your concern without delay.    Thank you, again, for choosing me as your health care partner. I appreciate your trust and look forward to learning more about you.    Evalene Arts, FNP-C

## 2024-01-30 NOTE — Progress Notes (Unsigned)
 Established Patient Office Visit  Subjective  Patient ID: Shelly Harris, female    DOB: March 18, 2001  Age: 23 y.o. MRN: [id number]  Discussed the use of AI scribe software for clinical note transcription with the patient, who gave verbal consent to proceed.  History of Present Illness   Shelly Harris is a 23 year old female who presents with concerns about weight management and anxiety.  She experiences anxiety, primarily due to overthinking, and finds Wellbutrin  at 150 mg once daily effective in reducing panic attacks, although it was prescribed twice daily. Her anxiety is more prominent than depression. Celexa  was not helpful for her, and she reports that her mother also experienced weight gain while taking it.  She is concerned about weight management, noting no significant changes in weight despite increased physical activity and dietary efforts. She has used food as a coping mechanism for emotions and is interested in exploring weight loss options. She is anxious about potential side effects of new medications, particularly nausea, and considers using Zofran  to manage this.  She works at [employer] with a variable schedule, which she considers when planning medication administration. She is concerned about the potential for loose skin with weight loss, noting that younger individuals may have better skin elasticity.         01/30/2024    3:19 PM 12/18/2023    9:51 AM 07/13/2022   10:07 AM 04/18/2022    2:12 PM  GAD 7 : Generalized Anxiety Score  Nervous, Anxious, on Edge 2 2 3  0  Control/stop worrying 1 1 3  0  Worry too much - different things 2 2 3  0  Trouble relaxing 1 1 3  0  Restless 1 0 3 0  Easily annoyed or irritable 0 1 3 0  Afraid - awful might happen 0 2 3 0  Total GAD 7 Score 7 9 21  0  Anxiety Difficulty Somewhat difficult Somewhat difficult Extremely difficult Not difficult at all       01/30/2024    3:15 PM 12/18/2023    9:50 AM 07/13/2022   10:06 AM  PHQ9 SCORE  ONLY  PHQ-9 Total Score 11 9  24       Data saved with a previous flowsheet row definition   ROS: see HPI    Objective:     BP 124/74   Pulse 83   Resp 16   Ht 5' 4 (1.626 m)   Wt (!) 322 lb (146.1 kg)   SpO2 98%   BMI 55.27 kg/m  BP Readings from Last 3 Encounters:  01/30/24 124/74  12/18/23 (!) 160/99  06/15/23 128/76     Physical Exam Vitals reviewed.  Constitutional:      Appearance: Normal appearance. She is obese.  Cardiovascular:     Rate and Rhythm: Normal rate and regular rhythm.     Pulses: Normal pulses.     Heart sounds: Normal heart sounds.  Pulmonary:     Effort: Pulmonary effort is normal.     Breath sounds: Normal breath sounds.  Neurological:     Mental Status: She is alert.  Psychiatric:        Mood and Affect: Mood normal.        Behavior: Behavior normal.     Assessment & Plan:   1. Morbid obesity (HCC) (Primary) She is motivated to lose weight and open to new treatments. Prescribed Zepbound  for weight loss, starting at the lowest dose, and Zofran  to manage potential nausea associated with Zepbound .  Recommended lifestyle modifications, including eating leaner proteins and avoiding fatty foods. Encouraged regular physical activity to support weight loss and improve skin elasticity. Will follow-up in 4 weeks to determine if patient is tolerating Zepbound  and losing weight.  - tirzepatide  (ZEPBOUND ) 2.5 MG/0.5ML Pen; Inject 2.5 mg into the skin once a week.  Dispense: 2 mL; Refill: 1 - ondansetron  (ZOFRAN -ODT) 4 MG disintegrating tablet; Take 1 tablet (4 mg total) by mouth every 8 (eight) hours as needed for nausea or vomiting.  Dispense: 30 tablet; Refill: 0  2. Anxiety and depression Currently taking Wellbutrin  75mg  daily. Genetic testing (GeneSight) indicates Celexa  is not beneficial. She reports that wellbutrin  helps prevent panic attacks but does not significantly impact day-to-day anxiety. Advised taking Wellbutrin  150 mg daily, as originally  prescribed. Monitor response to increased Wellbutrin  dosage before considering additional medications for anxiety.  3. Hypercholesteremia Concerned about cholesterol levels and making dietary changes. Weight loss medication and lifestyle modifications may improve cholesterol levels. Schedule follow-up appointment in a few months to re-evaluate cholesterol levels. Continue dietary modifications to reduce cholesterol, including avoiding red meat and fatty foods.    Return in about 4 weeks (around 02/27/2024) for weight loss f/u , Mood f/u.    Evalene Arts, FNP

## 2024-01-31 ENCOUNTER — Telehealth: Payer: Self-pay

## 2024-01-31 NOTE — Telephone Encounter (Signed)
(  Key: [id number]) Rx #: [id number] Zepbound  2.5MG /0.5ML pen-injectors Form [place] Complete Health Managed Medicaid Electronic Prior Authorization Request Form

## 2024-01-31 NOTE — Telephone Encounter (Signed)
 Your prior authorization request has been denied. Complete Appeal Your request for prior authorization was denied, but an appeal is available for your patient. For assistance, contact our support team at [phone].  Message from plan: Denied. Per the health plan preferred drug list, at least 1 preferred drugs must be tried before requesting this drug or tell us  why the member cannot try any preferred alternatives.  Please send us  supporting chart notes and lab results. Here is a list of preferred alternatives: Wegovy.

## 2024-02-08 ENCOUNTER — Telehealth: Payer: Self-pay | Admitting: Family Medicine

## 2024-02-08 NOTE — Telephone Encounter (Signed)
 Copied from CRM #[id number]. Topic: Clinical - Medication Question >> Feb 08, 2024  4:05 PM Sophia H wrote: Reason for CRM: Patient is checking in on rx for tirzepatide  (ZEPBOUND ) 2.5 MG/0.5ML Pen. Advised looks like it was being appealed since PA was originally denied. Please reach out and clarify. # [phone]

## 2024-02-09 ENCOUNTER — Other Ambulatory Visit: Payer: Self-pay | Admitting: Family Medicine

## 2024-02-12 ENCOUNTER — Other Ambulatory Visit: Payer: Self-pay | Admitting: Family Medicine

## 2024-02-12 ENCOUNTER — Telehealth: Payer: Self-pay

## 2024-02-12 MED ORDER — SEMAGLUTIDE-WEIGHT MANAGEMENT 0.25 MG/0.5ML ~~LOC~~ SOAJ: 0.2500 mg | SUBCUTANEOUS | 0 refills | Status: AC

## 2024-02-12 MED ORDER — SEMAGLUTIDE-WEIGHT MANAGEMENT 1 MG/0.5ML ~~LOC~~ SOAJ: 1.0000 mg | SUBCUTANEOUS | 0 refills | Status: AC

## 2024-02-12 MED ORDER — SEMAGLUTIDE-WEIGHT MANAGEMENT 1.7 MG/0.75ML ~~LOC~~ SOAJ: 1.7000 mg | SUBCUTANEOUS | 0 refills | Status: AC

## 2024-02-12 MED ORDER — SEMAGLUTIDE-WEIGHT MANAGEMENT 2.4 MG/0.75ML ~~LOC~~ SOAJ: 2.4000 mg | SUBCUTANEOUS | 0 refills | Status: AC

## 2024-02-12 MED ORDER — SEMAGLUTIDE-WEIGHT MANAGEMENT 0.5 MG/0.5ML ~~LOC~~ SOAJ: 0.5000 mg | SUBCUTANEOUS | 0 refills | Status: AC

## 2024-02-12 NOTE — Telephone Encounter (Signed)
 Your prior authorization for Wegovy  has been approved!  Message from plan: Approved. Approved for WEGOVY  Soln Auto-inj 0.5MG /0.5ML, quantity up to 2 per 28 days, under the pharmacy benefit.   The drug has been approved from 02/12/2024 to 08/10/2024.   Generic or biosimilar substitution may be required when available and preferred on the formulary. Please note that dispensing of non-maintenance and specialty medications may be limited to a monthly supply.   Authorization Expiration Date: August 10, 2024.

## 2024-02-12 NOTE — Telephone Encounter (Signed)
 Key: [id number])  Rx #: [id number]  Wegovy  0.5MG /0.5ML auto-injectors  Form [place] Complete Health Managed Medicaid Electronic Prior Authorization Request Form

## 2024-02-13 ENCOUNTER — Telehealth: Payer: Self-pay

## 2024-02-13 NOTE — Telephone Encounter (Signed)
 KeyBETHA CHARITY)  Rx #: [id number]  Wegovy  0.25MG /0.5ML auto-injectors  Form [place] Complete Health Managed Medicaid Electronic Prior Authorization Request Form

## 2024-02-13 NOTE — Telephone Encounter (Signed)
 Your prior authorization for Wegovy  has been approved!  Message from plan: Approved. Approved for WEGOVY  Soln Auto-inj 0.25MG /0.5ML, quantity up to 2 per 28 days, under the pharmacy benefit.   The drug has been approved from 02/13/2024 to 03/14/2024.   Authorization Expiration Date: March 14, 2024.

## 2024-02-27 ENCOUNTER — Ambulatory Visit: Admitting: Family Medicine

## 2024-03-03 ENCOUNTER — Encounter: Payer: Self-pay | Admitting: Family Medicine

## 2024-03-06 ENCOUNTER — Ambulatory Visit: Admitting: Family Medicine

## 2024-03-11 ENCOUNTER — Ambulatory Visit: Admitting: Family Medicine

## 2024-03-14 ENCOUNTER — Other Ambulatory Visit: Payer: Self-pay | Admitting: Medical Genetics

## 2024-03-14 DIAGNOSIS — Z006 Encounter for examination for normal comparison and control in clinical research program: Secondary | ICD-10-CM

## 2024-03-18 ENCOUNTER — Telehealth: Payer: Self-pay

## 2024-03-18 NOTE — Telephone Encounter (Signed)
(  Key: [facility]) Wegovy  0.25MG /0.5ML auto-injectors Form [place] Complete Health Managed Medicaid Electronic Prior Authorization Request Form

## 2024-03-27 ENCOUNTER — Ambulatory Visit: Admitting: Family Medicine

## 2024-04-12 ENCOUNTER — Telehealth: Payer: Self-pay | Admitting: *Deleted

## 2024-04-12 DIAGNOSIS — F419 Anxiety disorder, unspecified: Secondary | ICD-10-CM

## 2024-04-12 NOTE — Progress Notes (Signed)
 Complex Care Management Note Care Guide Note  04/12/2024 Name: Shelly Harris MRN: [id number] DOB: 2000-09-12   Complex Care Management Outreach Attempts: An unsuccessful telephone outreach was attempted today to offer the patient information about available complex care management services.  Follow Up Plan:  Additional outreach attempts will be made to offer the patient complex care management information and services.   Encounter Outcome:  No Answer  Thedford Franks, CMA [place]  [facility], [facility] Guide Direct Dial: [phone]  Fax: [phone] Website: [email or web address]

## 2024-04-16 NOTE — Progress Notes (Unsigned)
 Complex Care Management Note Care Guide Note  04/16/2024 Name: Shelly Harris MRN: [id number] DOB: 2000/12/12   Complex Care Management Outreach Attempts: A second unsuccessful outreach was attempted today to offer the patient with information about available complex care management services.  Follow Up Plan:  Additional outreach attempts will be made to offer the patient complex care management information and services.   Encounter Outcome:  No Answer  Thedford Franks, CMA [place]  [facility], [facility] Guide Direct Dial: [phone]  Fax: [phone] Website: [email or web address]

## 2024-04-17 ENCOUNTER — Encounter: Payer: Self-pay | Admitting: Family Medicine

## 2024-04-17 DIAGNOSIS — F331 Major depressive disorder, recurrent, moderate: Secondary | ICD-10-CM

## 2024-04-17 DIAGNOSIS — F411 Generalized anxiety disorder: Secondary | ICD-10-CM

## 2024-04-17 NOTE — Progress Notes (Signed)
 Complex Care Management Note Care Guide Note  04/17/2024 Name: Shelly Harris MRN: [id number] DOB: 06-Feb-2001   Complex Care Management Outreach Attempts: A third unsuccessful outreach was attempted today to offer the patient with information about available complex care management services.  Follow Up Plan:  No further outreach attempts will be made at this time. We have been unable to contact the patient to offer or enroll patient in complex care management services.  Encounter Outcome:  No Answer  Thedford Franks, CMA [place]  [facility], [facility] Guide Direct Dial: [phone]  Fax: [phone] Website: [email or web address]

## 2024-04-18 MED ORDER — CITALOPRAM HYDROBROMIDE 20 MG PO TABS: 20.0000 mg | ORAL_TABLET | Freq: Every day | ORAL | 1 refills | Status: AC

## 2024-04-18 MED ORDER — BUPROPION HCL ER (XL) 150 MG PO TB24: 150.0000 mg | ORAL_TABLET | Freq: Every day | ORAL | 0 refills | Status: AC

## 2024-04-24 ENCOUNTER — Ambulatory Visit: Admitting: Family Medicine

## 2024-04-24 NOTE — Progress Notes (Deleted)
   Established Patient Office Visit  Subjective  Patient ID: Shelly Harris, female    DOB: 29-Oct-2000  Age: 23 y.o. MRN: [id number]  No chief complaint on file.  WEIGHT MANAGEMENT: Shelly Harris presents for weight management. She is currently taking  Adhering to healthy diet: *** Current dietary plan: *** Regular exercise regimen: ***  Medications tried in the past: ***  Wt Readings from Last 3 Encounters:  01/30/24 (!) 322 lb (146.1 kg)  12/18/23 (!) 325 lb 6.4 oz (147.6 kg)  06/15/23 210 lb (95.3 kg)   ANXIETY: Shelly Harris presents for the medical management of anxiety.  Current medication regimen: Celexa  20mg  daily & Wellbutrin  150mg  daily  Counseling: *** Well controlled: *** Denies ***SI/HI.     01/30/2024    3:19 PM 12/18/2023    9:51 AM 07/13/2022   10:07 AM 04/18/2022    2:12 PM  GAD 7 : Generalized Anxiety Score  Nervous, Anxious, on Edge 2 2 3  0  Control/stop worrying 1 1 3  0  Worry too much - different things 2 2 3  0  Trouble relaxing 1 1 3  0  Restless 1 0 3 0  Easily annoyed or irritable 0 1 3 0  Afraid - awful might happen 0 2 3 0  Total GAD 7 Score 7 9 21  0  Anxiety Difficulty Somewhat difficult Somewhat difficult Extremely difficult Not difficult at all      01/30/2024    3:15 PM 12/18/2023    9:50 AM 07/13/2022   10:06 AM  PHQ9 SCORE ONLY  PHQ-9 Total Score 11 9  24       Data saved with a previous flowsheet row definition     ROS: see HPI    Objective:     There were no vitals taken for this visit. BP Readings from Last 3 Encounters:  01/30/24 124/74  12/18/23 (!) 160/99  06/15/23 128/76     Physical Exam    Assessment & Plan:  There are no diagnoses linked to this encounter.   No follow-ups on file.    Evalene Arts, FNP

## 2024-04-30 ENCOUNTER — Ambulatory Visit: Admitting: Family Medicine

## 2024-05-01 ENCOUNTER — Ambulatory Visit: Admitting: Family Medicine

## 2024-05-01 ENCOUNTER — Encounter: Payer: Self-pay | Admitting: Family Medicine

## 2024-06-20 ENCOUNTER — Encounter

## 2024-06-28 ENCOUNTER — Ambulatory Visit
Admission: RE | Admit: 2024-06-28 | Discharge: 2024-06-28 | Disposition: A | Discharge status: Home / Self Care | Source: Ambulatory Visit | Attending: Physician Assistant

## 2024-06-28 VITALS — BP 118/77 | HR 73 | Temp 98.1°F | Resp 16 | Ht 64.0 in | Wt 322.1 lb

## 2024-06-28 DIAGNOSIS — R102 Pelvic and perineal pain unspecified side: Secondary | ICD-10-CM | POA: Insufficient documentation

## 2024-06-28 DIAGNOSIS — R300 Dysuria: Secondary | ICD-10-CM | POA: Diagnosis present

## 2024-06-28 DIAGNOSIS — Z113 Encounter for screening for infections with a predominantly sexual mode of transmission: Secondary | ICD-10-CM | POA: Diagnosis present

## 2024-06-28 LAB — POCT URINE DIPSTICK
Bilirubin, UA: NEGATIVE
Glucose, UA: NEGATIVE mg/dL
Ketones, POC UA: NEGATIVE mg/dL
Leukocytes, UA: NEGATIVE
Nitrite, UA: NEGATIVE
Protein Ur, POC: NEGATIVE mg/dL
Spec Grav, UA: 1.025
Urobilinogen, UA: 0.2 U/dL
pH, UA: 6.5

## 2024-06-28 NOTE — Discharge Instructions (Addendum)
-   Urine test not consistent with UTI but we will send for culture and if culture is positive treat with antibiotics. - You are also being tested for vaginal infections and sexually transmitted infections.  All results will come through MyChart in a couple days.

## 2024-06-28 NOTE — ED Triage Notes (Signed)
 Patient reports dysuria, suprapubic cramping and urinary frequency that started yesterday.  Patient denies fevers.

## 2024-06-30 ENCOUNTER — Ambulatory Visit (HOSPITAL_COMMUNITY): Payer: Self-pay

## 2024-06-30 LAB — URINE CULTURE

## 2024-07-01 LAB — CERVICOVAGINAL ANCILLARY ONLY
Bacterial Vaginitis (gardnerella): NEGATIVE
Candida Glabrata: NEGATIVE
Candida Vaginitis: NEGATIVE
Chlamydia: NEGATIVE
Comment: NEGATIVE
Comment: NEGATIVE
Comment: NEGATIVE
Comment: NEGATIVE
Comment: NEGATIVE
Comment: NORMAL
Neisseria Gonorrhea: NEGATIVE
Trichomonas: NEGATIVE
